# Patient Record
Sex: Female | Born: 1980 | Hispanic: Yes | Marital: Married | Smoking: Never smoker
Health system: Southern US, Community
[De-identification: ages and names within clinical notes are randomized; demographics above are authoritative.]

## PROBLEM LIST (undated history)

## (undated) HISTORY — PX: ABDOMINAL HYSTERECTOMY: SHX81

---

## 2015-02-04 ENCOUNTER — Emergency Department (INDEPENDENT_AMBULATORY_CARE_PROVIDER_SITE_OTHER)
Admission: EM | Admit: 2015-02-04 | Discharge: 2015-02-04 | Disposition: A | Discharge status: Home / Self Care | Payer: BLUE CROSS/BLUE SHIELD | Source: Home / Self Care | Attending: Emergency Medicine | Admitting: Emergency Medicine

## 2015-02-04 ENCOUNTER — Encounter: Payer: Self-pay | Admitting: *Deleted

## 2015-02-04 ENCOUNTER — Emergency Department (INDEPENDENT_AMBULATORY_CARE_PROVIDER_SITE_OTHER): Payer: BLUE CROSS/BLUE SHIELD

## 2015-02-04 DIAGNOSIS — M25512 Pain in left shoulder: Secondary | ICD-10-CM

## 2015-02-04 DIAGNOSIS — R52 Pain, unspecified: Secondary | ICD-10-CM

## 2015-02-04 DIAGNOSIS — R079 Chest pain, unspecified: Secondary | ICD-10-CM | POA: Diagnosis not present

## 2015-02-04 MED ORDER — IBUPROFEN 800 MG PO TABS: 800.0000 mg | ORAL_TABLET | Freq: Three times a day (TID) | ORAL | Status: DC

## 2015-02-04 MED ORDER — METHOCARBAMOL 500 MG PO TABS: 500.0000 mg | ORAL_TABLET | Freq: Two times a day (BID) | ORAL | Status: DC

## 2015-02-04 NOTE — Discharge Instructions (Signed)

## 2015-02-04 NOTE — ED Provider Notes (Signed)
CSN: [id number]     Arrival date & time 02/04/15  1846 History   First MD Initiated Contact with Patient 02/04/15 1908     Chief Complaint  Patient presents with  . Arm Pain   (Consider location/radiation/quality/duration/timing/severity/associated sxs/prior Treatment) Patient is a 34 y.o. female presenting with shoulder pain. The history is provided by the patient. No language interpreter was used.  Shoulder Pain Location:  Shoulder Time since incident:  2 days Injury: no   Shoulder location:  L shoulder Pain details:    Quality:  Aching   Radiates to:  Does not radiate   Severity:  Moderate   Onset quality:  Gradual   Timing:  Constant   Progression:  Worsening Dislocation: no   Foreign body present:  No foreign bodies Relieved by:  Nothing Worsened by:  Nothing tried Ineffective treatments:  None tried Associated symptoms: back pain     History reviewed. No pertinent past medical history. Past Surgical History  Procedure Laterality Date  . Abdominal hysterectomy     History reviewed. No pertinent family history. History  Substance Use Topics  . Smoking status: Never Smoker   . Smokeless tobacco: Not on file  . Alcohol Use: No   OB History    No data available     Review of Systems  Musculoskeletal: Positive for back pain.  All other systems reviewed and are negative. Pt works in a lab.   Pt complains of soreness in arm.  Pt reports pain in shoulder radiates down arm  Allergies  Review of patient's allergies indicates no known allergies.  Home Medications   Prior to Admission medications   Not on File   BP 122/80 mmHg  Pulse 73  Temp(Src) 98.7 F (37.1 C) (Oral)  Resp 16  Ht  (1.651 m)  Wt 175 lb (79.379 kg)  BMI 29.12 kg/m2  SpO2 99% Physical Exam  Constitutional: She is oriented to person, place, and time. She appears well-developed and well-nourished.  HENT:  Head: Normocephalic.  Eyes: EOM are normal.  Neck: Normal range of motion.   Pulmonary/Chest: Effort normal.  Abdominal: She exhibits no distension.  Musculoskeletal: Normal range of motion.  Pain with abduction and rotation of shoulder,  Ns and nv intact,   Tender at insertion ofchest. Pain with palpation,    Neurological: She is alert and oriented to person, place, and time.  Psychiatric: She has a normal mood and affect.  Nursing note and vitals reviewed.  EKG normal sinus, normal QRS, NOnspecific t changes,  No acute ED Course  Procedures (including critical care time) Labs Review Labs Reviewed - No data to display  Imaging Review Dg Chest 2 View  02/04/2015   CLINICAL DATA:  Left shoulder and left upper chest pain for 1 day  EXAM: CHEST  2 VIEW  COMPARISON:  None.  FINDINGS: The heart size and mediastinal contours are within normal limits. Both lungs are clear. The visualized skeletal structures are unremarkable.  IMPRESSION: No active cardiopulmonary disease.   Electronically Signed   By: Signa Kell M.D.   On: 02/04/2015 20:00   Dg Shoulder Left  02/04/2015   CLINICAL DATA:  34 year old female with left shoulder and left upper chest pain. Pain radiates down the left arm. No known injury.  EXAM: LEFT SHOULDER - 2+ VIEW  COMPARISON:  Concurrently obtained chest x-ray  FINDINGS: There is no evidence of fracture or dislocation. There is no evidence of arthropathy or other focal bone abnormality. Soft tissues  are unremarkable.  IMPRESSION: Negative.   Electronically Signed   By: Malachy MoanHeath  McCullough M.D.   On: 02/04/2015 20:03     MDM  I suspect pain is radicular  Shoulder or possibley neck.  Pain is not pulmonary or cardiac,  PERC is negative   1. Pain in joint, shoulder region, left   2. Pain    Ibuprofen Robaxin Follow up with Dr. Karie Schwalbe in one weel AVS    Lonia SkinnerLeslie K Arrow RockSofia, [place] JerseyPA-C 02/05/15 [phone]

## 2015-02-04 NOTE — ED Notes (Signed)
Pt c/o LT upper arm and shoulder pain x 2 days, with some nausea and SOB. She has taken ASA with no relief. BP at work was 141/80.

## 2015-10-06 ENCOUNTER — Emergency Department (INDEPENDENT_AMBULATORY_CARE_PROVIDER_SITE_OTHER): Payer: BLUE CROSS/BLUE SHIELD

## 2015-10-06 ENCOUNTER — Emergency Department (INDEPENDENT_AMBULATORY_CARE_PROVIDER_SITE_OTHER)
Admission: EM | Admit: 2015-10-06 | Discharge: 2015-10-06 | Disposition: A | Discharge status: Home / Self Care | Payer: BLUE CROSS/BLUE SHIELD | Source: Home / Self Care | Attending: Emergency Medicine | Admitting: Emergency Medicine

## 2015-10-06 ENCOUNTER — Encounter: Payer: Self-pay | Admitting: Emergency Medicine

## 2015-10-06 DIAGNOSIS — M7521 Bicipital tendinitis, right shoulder: Secondary | ICD-10-CM | POA: Diagnosis not present

## 2015-10-06 DIAGNOSIS — M25511 Pain in right shoulder: Secondary | ICD-10-CM | POA: Diagnosis not present

## 2015-10-06 MED ORDER — MELOXICAM 7.5 MG PO TABS: ORAL_TABLET | ORAL | Status: DC

## 2015-10-06 MED ORDER — HYDROCODONE-ACETAMINOPHEN 5-325 MG PO TABS: 1.0000 | ORAL_TABLET | ORAL | Status: DC | PRN

## 2015-10-06 NOTE — ED Notes (Signed)
Pt c/o right shoulder pain x 2 days.  No injury.

## 2015-10-06 NOTE — ED Provider Notes (Signed)
CSN: [id number]     Arrival date & time 10/06/15  [id number] History   First MD Initiated Contact with Patient 10/06/15 [phone]     Chief Complaint  Patient presents with  . Shoulder Pain   (Consider location/radiation/quality/duration/timing/severity/associated sxs/prior Treatment) Patient is a 34 y.o. female presenting with shoulder pain. The history is provided by the patient (And her sister). A language interpreter was used (Patient's sister. Patient declined obtaining any other interpreter. Patient speaks some [place], speaks Spanish well.).  Shoulder Pain Location:  Shoulder (Right) Time since incident:  2 days Upper extremity injury: Denies direct trauma, but she's done repetitive motions with right shoulder for the past week.   Pain details:    Quality:  Sharp   Radiates to:  Does not radiate   Severity:  Severe   Onset quality:  Unable to specify   Duration:  2 days   Timing:  Constant   Progression:  Worsening Prior injury to area:  No Relieved by:  Rest Worsened by:  Movement Ineffective treatments:  None tried Associated symptoms: decreased range of motion and stiffness   Associated symptoms: no back pain, no fever, no muscle weakness, no neck pain, no numbness, no swelling and no tingling   Risk factors: no known bone disorder, no frequent fractures and no recent illness     History reviewed. No pertinent past medical history. Past Surgical History  Procedure Laterality Date  . Abdominal hysterectomy     No family history on file. Social History  Substance Use Topics  . Smoking status: Never Smoker   . Smokeless tobacco: None  . Alcohol Use: No   OB History    No data available     Review of Systems  Constitutional: Negative for fever.  Respiratory: Negative for shortness of breath.   Cardiovascular: Negative for chest pain.  Gastrointestinal: Negative for abdominal pain.  Musculoskeletal: Positive for stiffness. Negative for back pain and neck pain.  All  other systems reviewed and are negative.   Allergies  Review of patient's allergies indicates no known allergies.  Home Medications   Prior to Admission medications   Medication Sig Start Date End Date Taking? Authorizing Provider  Ketorolac Tromethamine (TORADOL ORAL PO) Take by mouth.   Yes Historical Provider, MD  HYDROcodone-acetaminophen (NORCO/VICODIN) 5-325 MG tablet Take 1-2 tablets by mouth every 4 (four) hours as needed for severe pain. Take with food. 10/06/15   Lajean Manesavid Massey, MD  meloxicam (MOBIC) 7.5 MG tablet Take 1 twice a day as needed for pain. Take with food. (Do not take with any other NSAID.) 10/06/15   Lajean Manesavid Massey, MD   Meds Ordered and Administered this Visit  Medications - No data to display  BP 122/72 mmHg  Pulse 66  Temp(Src) 98.3 F (36.8 C) (Oral)  Ht 5\' 5"  (1.651 m)  Wt 182 lb 12 oz (82.895 kg)  BMI 30.41 kg/m2  SpO2 100% No data found.   Physical Exam  Constitutional: She is oriented to person, place, and time. She appears well-developed and well-nourished. No distress.  No cardiorespiratory distress, but she is very uncomfortable from right shoulder pain, splinting right shoulder to avoid movement.  HENT:  Head: Normocephalic and atraumatic.  Eyes: Conjunctivae and EOM are normal. Pupils are equal, round, and reactive to light. No scleral icterus.  Neck: Normal range of motion. No JVD present. No tracheal deviation present.  Cardiovascular: Normal rate and normal heart sounds.   Pulmonary/Chest: Effort normal and breath sounds normal.  Abdominal:  She exhibits no distension.  Musculoskeletal:  See below  Neurological: She is alert and oriented to person, place, and time.  Skin: Skin is warm. No rash noted.  Psychiatric: She has a normal mood and affect.  Nursing note and vitals reviewed.  Right shoulder:  Exquisitely tender right anterior shoulder, especially bicipital tendon. Decreased range of motion. Neurovascular intact. No  instability. Negative empty can sign. Remainder of exam of right upper extremity, including right elbow, forearm, hand, is all within normal limits. Nontender. No C-spine tenderness or deformity. ED Course  Procedures (including critical care time)  Labs Review Labs Reviewed - No data to display  Imaging Review X-ray right shoulder: FINDINGS: Three views of the right shoulder submitted. No acute fracture or subluxation. AC joint and glenohumeral joint are preserved. IMPRESSION: Negative.  Electronically Signed  By: Natasha Mead M.D.  On: 10/06/2015 10:06   MDM   1. Right shoulder pain   2. Bicipital tendinitis of right shoulder    I reviewed normal x-ray results with patient. Treatment options discussed, as well as risks, benefits, alternatives. Patient voiced understanding and agreement with the following plans: Procedure: After informed consent obtained, after risks, benefits, alternatives discussed, following procedure performed: Betadine prep. 1 mL of 2% Xylocaine and 10 mg of Kenalog are injected into the right bicipital tendon area.  Patient tolerated procedure well without complications.  Afterward, patient noted some improvement in the pain. Improved from 8 out of 10 down to a 4/10 intensity.  Sling applied. Discharge Medication List as of 10/06/2015 10:34 AM    START taking these medications   Details  HYDROcodone-acetaminophen (NORCO/VICODIN) 5-325 MG tablet Take 1-2 tablets by mouth every 4 (four) hours as needed for severe pain. Take with food., Starting 10/06/2015, Until Discontinued, Print    meloxicam (MOBIC) 7.5 MG tablet Take 1 twice a day as needed for pain. Take with food. (Do not take with any other NSAID.), Print       An After Visit Summary (in Spanish, per patient preference) was printed and given to the patient. Questions invited and answered. Follow-up with orthopedist within 7 days.--Emergency room if any red flags. Patient voiced  understanding and agreement with above.     Lajean Manes, MD 10/13/15 [phone]

## 2015-10-06 NOTE — Discharge Instructions (Signed)
Tendinitis del tendn del bceps (proximal) y tenosinovitis, con rehabilitacin (Biceps Tendon Tendinitis [Proximal] and Tenosynovitis With Rehab) La tendinitis y la tenosinovitis comprenden la inflamacin de la membrana que rodea el tendn (vaina del tendn). El tendn proximal del bceps es vulnerable a la tendinitis y a la tenosinovitis, lo que ocasiona dolor y Associate Professor en la parte anterior del hombro y Cabin crew. La vaina del tendn segrega un lquido que lo [place] y le permite funcionar adecuadamente sin Engineer, mining. Cuando el tendn y su [place] se inflaman, el tendn no puede deslizarse suavemente dentro de la vaina y Passenger transport manager. El tendn proximal del bceps une el msculo del bceps a los dos huesos del hombro. Es importante para una funcin [place] del codo y para Programme researcher, broadcasting/film/video la palma de la mano hacia arriba con la [place] (supinacin). La tendinitis de la porcin proximal del tendn del bceps puede ser un esguince de [place] 1  2 del tendn. El esguince de [place] 1 implica que el tendn se ha estirado suavemente sin seales de desgarro ni alargamiento. No hay prdida de fuerza. El esguince de [place] 2 implica pequeos desgarros en las fibras del tendn. El tendn o msculo se estira y la fuerza por lo general disminuye.  SNTOMAS  Puede sentir dolor, sensibilidad, hinchazn, calor y enrojecimiento en la zona del hombro.  Dolor que [employer]endant Corporation al usar el hombro y el codo, Haematologist cuando se le opone una resistencia.  Movimientos limitados del hombro o el codo.  Ruido de "crack" (crepitacin) al mover o tocar el tendn o el codo. CAUSAS Los sntomas se deben a la inflamacin del tendn. Las causas de la inflamacin pueden ser:  Esguince provocado por un brusco incremento en la cantidad o en la intensidad de alguna [place], o por el [place].  Golpe directo o lesin en el codo (poco frecuente).  Uso excesivo o repetitivo de la funcin de doblar el codo o doblar la [place], particularmente al  llevar la palma [place] arriba, o con la hiperextensin del codo. LOS RIESGOS AUMENTAN CON  Los deportes que implican un contacto o actividades en las que se deba levantar el brazo por arriba del nivel de la cabeza, as como deportes de lanzamiento, gimnasia, levantar pesas y Academic librarian.  Trabajos pesados.  Poca fuerza y flexibilidad.  No hacer un precalentamiento adecuado. PREVENCIN  Precalentamiento adecuado y elongacin antes de la [place].  Permtase un tiempo de [employer].  Mantener la forma fsica:  [place], flexibilidad y resistencia muscular.  Capacidad cardiovascular.  Aprenda y USAA. PRONSTICO Con el tratamiento adecuado, la tendinitis del tendn proximal del bceps se cura dentro de las 6 semanas. La curacin generalmente es ms rpida si la causa ha sido un golpe directo, a diferencia de si se debe al [employer].  POSIBLES COMPLICACIONES:  Tiempo de curacin prolongado, si no se trata adecuadamente o no se le da el tiempo suficiente como para curarse.  Tendn crnicamente inflamado, que causa un dolor persistente que puede avanzar hacia un dolor constante y potencialmente a la ruptura del tendn.  Recurrencia de los sntomas, especialmente si la actividad se reanuda [address], con el uso [place], un golpe directo o una tcnica deficiente. TRATAMIENTO El tratamiento inicial incluye el uso de medicamentos y la aplicacin de hielo para reducir Chief Technology Officer y la inflamacin. Modifique las [employer] causen Engineer, mining, para reducir las probabilidades de que la afeccin empeore. Los ejercicios de elongacin y fortalecimiento deben realizarse para Air traffic controller  el uso correcto de los msculos del hombro. Los ejercicios pueden Management consultant o con un terapeuta. Podrn indicarle otros tratamientos como el ultrasonido o la terapia con Airline pilot. En algunos casos se indica una inyeccin de corticoides para reducir la  inflamacin de la [place] del tendn. Generalmente la ciruga no es necesaria. A veces, si los sntomas duran ms de 6 meses, se aconsejar ciruga para desprender el tendn y Youth worker en el hueso del brazo. La ciruga para corregir otros problemas del hombro que pueden contribuir a la tendinits se recomiendan antes de la ciruga para la tendinitis misma.  MEDICAMENTOS   Si es necesaria la administracin de medicamentos para Chief Technology Officer, se recomiendan los antiinflamatorios no esteroides, como aspirina e ibuprofeno y otros calmantes menores, como acetaminofeno.  No tome medicamentos para el dolor dentro de los [address] previos a la [place].  Si su mdico lo considera necesario, Armed forces training and education officer. Utilcelos como se le indique y slo cuando lo necesite.  Se podrn recomendar inyecciones de corticoesteorides. Estas inyecciones deben reservarse para los casos ms graves, porque slo se pueden administrar una determinada cantidad de veces. CALOR Y FRO   El fro (con hielo) debe aplicarse durante 10 a 15 minutos cada 2  3 horas para reducir la inflamacin y Chief Technology Officer e inmediatamente despus de cualquier actividad que agrava los sntomas. Utilice bolsas o un masaje de hielo.  El calor puede usarse antes de Therapist, music y de las actividades de fortalecimiento indicadas por el profesional, le fisioterapeuta o Orthoptist. Utilice una bolsa trmica o un pao hmedo. SOLICITE ATENCIN MDICA SI:   Los sntomas empeoran o no mejoran en 2 semanas, a pesar de Medical illustrator.  Desarrolla nuevos e inexplicables sntomas. (Los medicamentos indicados en el tratamiento le ocasionan efectos secundarios). EJERCICIOS EJERCICIOS DE AMPLITUD DE MOVIMIENTOS Y ELONGACIN Tendn del bceps (proximal) Estos ejercicios le ayudarn en la recuperacin de la lesin. Los sntomas podrn aliviarse con o sin una asistencia adicional de su mdico, fisioterapeuta o Herbalist. Al completar estos ejercicios,  recuerde:   Restaurar la flexibilidad del tejido ayuda a que las articulaciones recuperen el movimiento normal. Esto permite que el movimiento y la actividad sea ms saludables y menos dolorosos.  Para que sea efectiva, cada elongacin debe realizarse durante al menos 30 segundos.  La elongacin nunca debe ser dolorosa. Deber sentir slo un alargamiento o distensin suave del tejido que estira. ELONGACIN Flexin [employer] de pie con una buena postura. Tome un palo de escoba o caa con una palma derecha / izquierdo hacia abajo y la otra palma hacia arriba de modo que sus manos tengan una separacin de un poco ms que el ancho de sus hombros.  Mantenga su codo derecha / izquierdo recto y los msculos de los hombros [place], y [place] palo con su manos opuesta para elevar su brazo derecha / izquierdo frente a su cuerpo y [employer]ack cabeza. Levante el brazo hasta que sientas un estiramiento en el hombro derecha / izquierdo, pero sin sentir un aumento del dolor.  Evite encoger su hombro derecha / izquierdo cuando eleve el brazo manteniendo el omplato hacia abajo y hacia la columna vertebral en la zona media de la espalda. Mantenga esta posicin durante __________ segundos.  Vuelva lentamente a la posicin inicial.  Reptalo __________ veces. Realice este ejercicio __________ veces por da. FUERZA [place], supina  Acustese sobre la espada. Tome un palo de escoba o caa con una palma derecha /  izquierdo [place] abajo y la otra palma hacia arriba de modo que sus manos tengan una separacin de un poco ms que el ancho de sus hombros.  Mantenga su codo derecha / izquierdo recto y los msculos de los hombros Benjamin Perez, y [place] con su mano opuesta para elevar su brazo derecha / izquierdo hacia un lado de su cuerpo y [employer] la cabeza. Levante el brazo hasta que sientas un estiramiento en el hombro derecha / izquierdo, pero sin sentir un aumento del dolor.  Evite encoger su  hombro derecha / izquierdo cuando eleve el brazo manteniendo el omplato hacia abajo y hacia la columna vertebral en la zona media de la espalda. Mantenga esta posicin durante __________ segundos.  Vuelva lentamente a la posicin inicial.  Reptalo __________ veces. Realice este ejercicio __________ veces por da. AMPLITUD DE MOVIMIENTOS - Flexin, asistida activa  Acustese sobre la espada. Podr doblar las rodillas para estar ms cmodo  Tome un palo de escoba o un bastn de modo que sus manos queden a la misma distancia que sus hombros. Su mano derecha / izquierdo debe asir un extremo del palo de modo que quede colocada con el pulgar hacia arriba, como si fuera a Optometrist.  Usando su brazo sano como gua, eleve su brazo Sales executive / izquierdo sobre la cabeza Teacher, adult education sentir un suave estiramiento en el hombro. Mantenga esta posicin durante __________ segundos.  Use el palo para ayudar a su brazo derecha / izquierdo a volver a Architect.  Reptalo __________ veces. Realice este ejercicio __________ veces por da. ELONGACIN Flexin De pie  Colquese frente a una pared. [place] con sus dedos derecha / izquierdo hacia arriba por la pared hasta sentir un estiramiento moderado en el hombro. A medida que su mano asciende, podr sentir la necesidad de dar un paso hacia la pared o use el marco de una puerta para caminar a travs de la misma.  Evite encoger su hombro derecha / izquierdo cuando eleve el brazo manteniendo el omplato hacia abajo y hacia la columna vertebral en la zona media de la espalda.  Mantenga esta posicin durante __________ segundos. Si es necesario, utilice su otra mano para facilitar el estriramiento y Programme researcher, broadcasting/film/video a la posicin inicial.  Reptalo __________ D[employer] Restaurants. Realice este estiramiento __________ [place] por da. AMPLITUD DE MOVIMIENTOS Rotacin interna  Tome un palo con ambas manos por detrs de la espalda, con las palmas [place].  De pie y erguido en  buena postura, deslice el palo hacia arriba por la espalda hasta sentir un suave estiramiento en la zona anterior de los hombros.  Mantenga esta posicin durante __________ segundos. Vuelva lentamente a la posicin inicial. Reptalo __________ veces. Realice este ejercicio __________ veces por da.  ELONGACIN Rotacin interna  Coloque la mano derecha / izquierdo detrs de la espalda con las palmas [place] arriba.  Coloque una toalla o cinturn sobre el hombro opuesto. Tome la toalla con su mano derecha / izquierdo  Manteniendo una postura erguida, tire suavemente de la toalla hasta sentir un estiramiento en la parte frontal del hombroderecha / izquierdo  Garment/textile technologist su hombro derecha / izquierdo cuando eleve el brazo manteniendo el omplato hacia abajo y hacia la columna vertebral en la zona media de la espalda.  Mantenga esta posicin durante __________ segundos. Afloje la tensin bajando la mano opuesta. Reptalo __________ veces. Realice este ejercicio __________ veces por da.  EJERCICIOS DE FORTALECIMIENTO - Tendinitis del tendn del bceps (proximal) Estos ejercicios lo ayudarn  a recobrar la fuerza, luego de que el [employer] retire el yeso o soporte. Los sntomas podrn desaparecer con o sin mayor intervencin del profesional, el fisioterapeuta o Orthoptist. Al completar estos ejercicios, recuerde:   Los msculos pueden ganar la resistencia y la fuerza necesarias para las actividades diarias a travs de ejercicios controlados.  Realice los ejercicios como se lo indic el mdico, el fisioterapeuta o Orthoptist. Aumente la resistencia y las repeticiones segn se le haya indicado.  Podr experimentar dolor o cansancio muscular, pero el dolor o molestia que trata de eliminar a travs de los ejercicios nunca debe empeorar. Si el dolor empeora, detngase y asegrese de que est siguiendo las directivas correctamente. Si an siente dolor luego de Education officer, environmental lo ajustes necesarios, deber  discontinuar el ejercicio hasta que pueda conversar con el profesional sobre el problema. FUERZA Flexin del codo, isomtricos  Prese o sintese erguido [employer]. Coloque su brazo Sales executive / izquierdo para que la palma de su mano quede hacia arriba a la altura de su cintura.  Coloque la mano opuesta sobre el Product manager. Empuje suavemente hacia abajo mientras su brazo derecha / izquierdo opone resistencia. Empuje tan intensamente como pueda con ambos brazos sin causar Scientist, research (medical) ni realizar movimientos con su codo derecha / izquierdo. Mantenga esta posicin durante __________ segundos.  Libere la tensin de ambos brazos gradualmente. Permita que sus msculos se relajen completamente antes de repetir. Reptalo __________ veces. Realice este ejercicio __________ veces por da. FUERZA Flexin del hombro, isomtricos  Manteniendo una buena postura y enfrentando una pared, prese o sintese a 4 a 6 pulgadas de la misma.  Manteniendo su codo derecha / izquierdo derecho, presione suavemente la parte superior del puo contra la pared. Aumente gradualmente la presin [employer] pueda sin llegar a encojer el hombro ni Tax inspector.  Mantenga esta posicin durante __________ segundos.  Libere la tensin lentamente. Relaje los msculos de los hombros completamente antes de repetir. Reptalo __________ veces. Realice este ejercicio __________ veces por da.  FUERZA Flexores del codo, supinacin  Con una buena [place], pngase de pie o sintese en una silla firme sin apoyabrazos. Permita que su brazo derecha / izquierdo descanse a su lado con lapalmamirando hacia adelante.  Sosteniendo un peso de __________ o una banda o tubo de goma para ejercicios,  Lleve la mano hacia el hombro.  Deje que sus msculos controlen la resistencia mientras la mano vuelve a su lado. Reptalo __________ veces. Realice este ejercicio __________ veces por da.  FUERZA Flexin del hombro  Prese  o sintese en una postura correcta. Sostenga un peso de __________ Cheron Schaumann banda de goma para ejercicios, de modo que su mano quede con el pulgar hacia arriba, como cuando [employer].  Levante lentamente el brazo derecha / izquierdo lo ms lejos que pueda, sin Chemical engineer. Inicialmente, muchas personas slo pueden levantar la mano hasta la altura del hombro.  Evite encoger su hombro derecha / izquierdo cuando eleve el brazo manteniendo el omplato hacia abajo y hacia la columna vertebral en la zona media de la espalda.  Mantenga esta posicin durante __________ segundos. Controle el descenso de la mano de modo que vuelva a la posicin inicial lo ms lentamente posible. Reptalo __________ veces. Realice este ejercicio __________ veces por da.    Esta informacin no tiene Theme park manager el consejo del mdico. Asegrese de hacerle al mdico cualquier pregunta que tenga.   Document Released: 07/14/2006  Document Revised: 10/18/2014 Elsevier Interactive Patient Education [employer]2016 Elsevier Inc.

## 2017-04-10 IMAGING — CR DG SHOULDER 2+V*R*
3 series · 3 of 3 positions shown · non-contrast
Comparison: None.

CLINICAL DATA: Right shoulder pain for 3 days, no known injury

EXAM:
RIGHT SHOULDER - 2+ VIEW

[shoulder grashey]
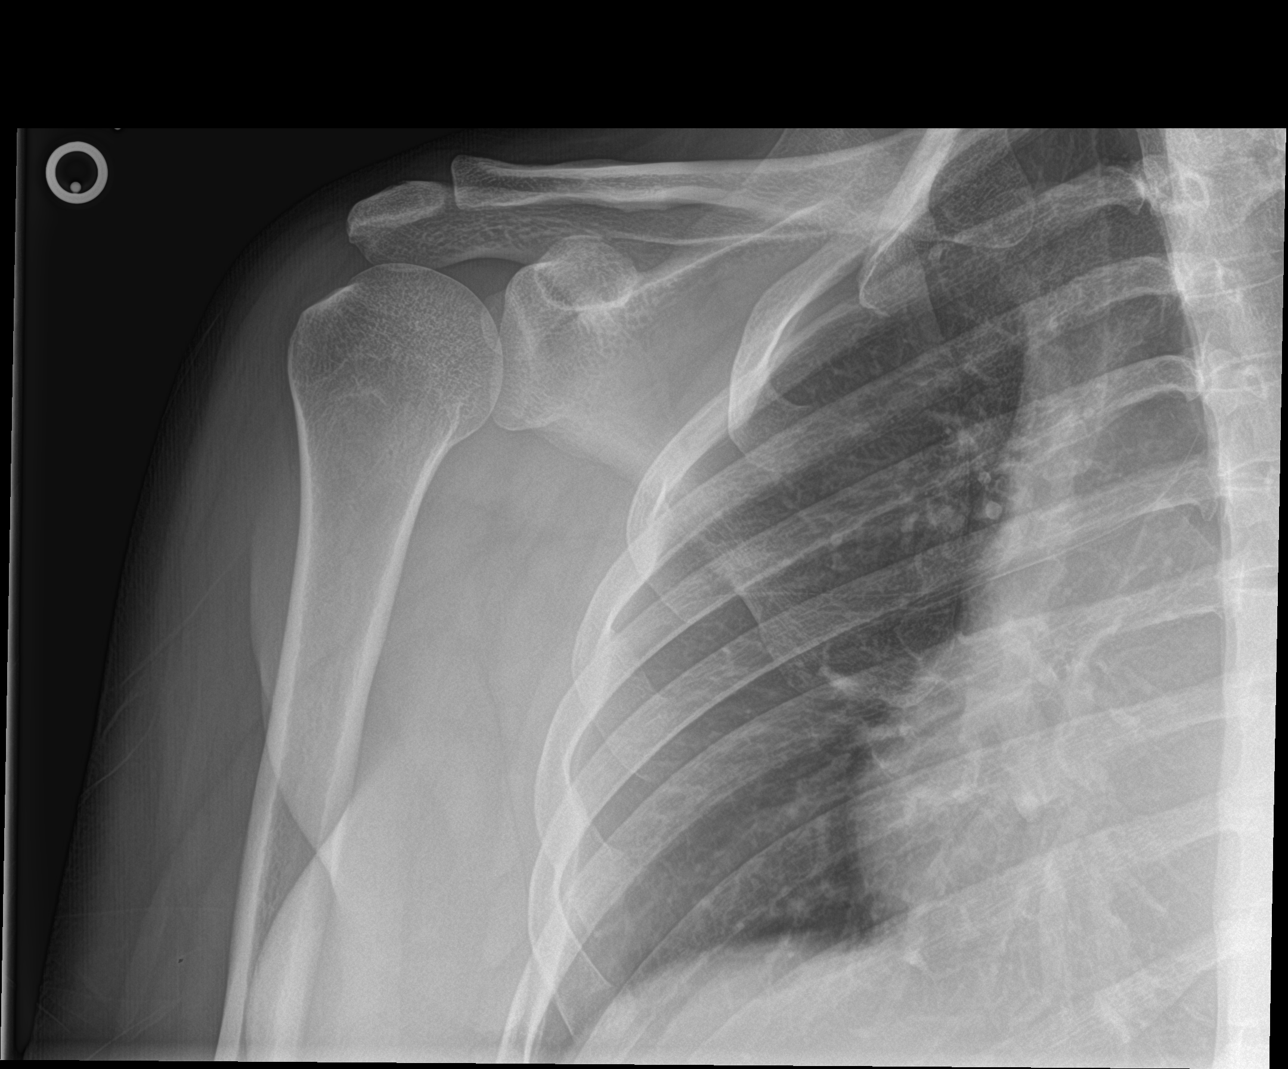

[shoulder y view]
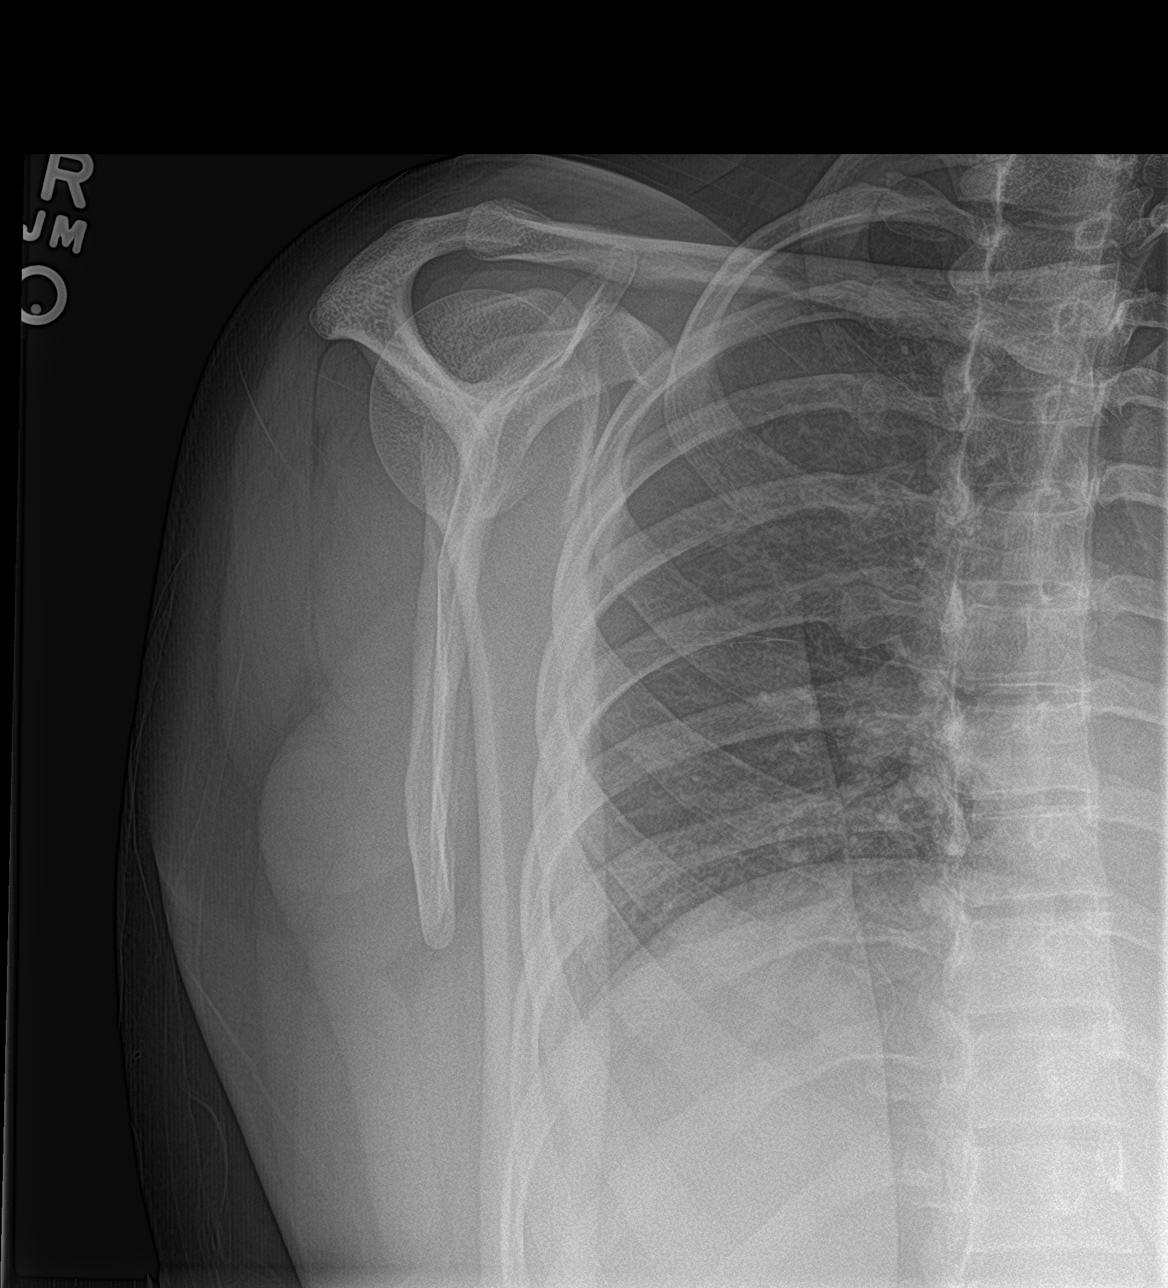

[shoulder axillary]
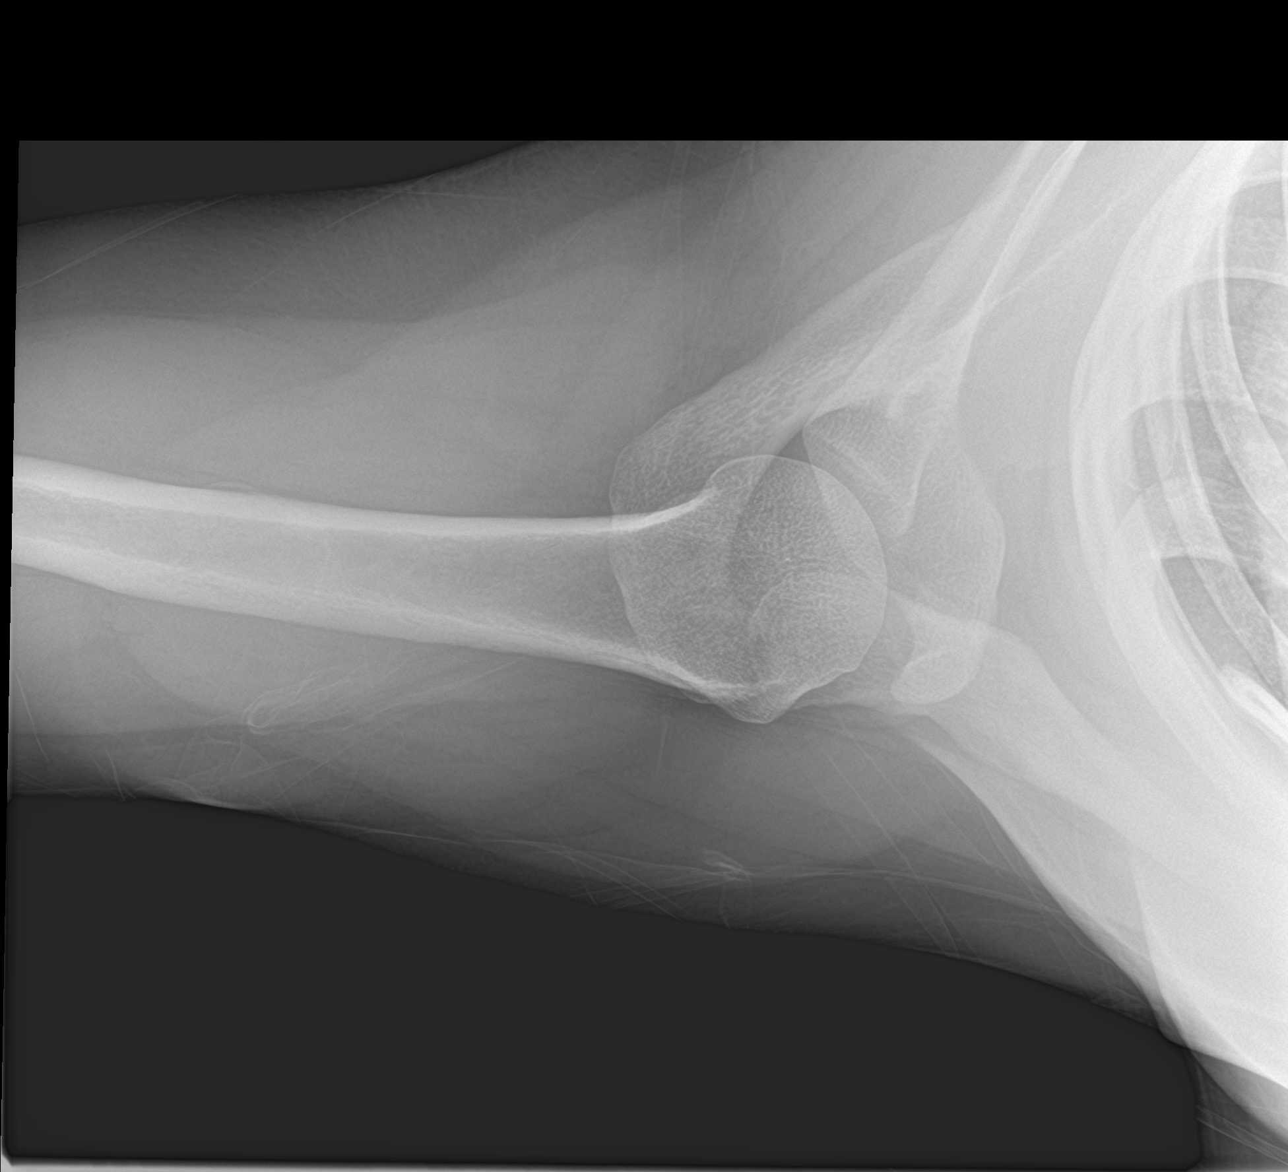

[3 of 3 positions shown; findings below may reference images not displayed]

FINDINGS: Three views of the right shoulder submitted. No acute fracture or
subluxation. AC joint and glenohumeral joint are preserved.
IMPRESSION: Negative.

## 2020-10-29 DIAGNOSIS — U099 Post covid-19 condition, unspecified: Secondary | ICD-10-CM | POA: Diagnosis not present

## 2020-12-28 DIAGNOSIS — R110 Nausea: Secondary | ICD-10-CM | POA: Diagnosis not present

## 2020-12-28 DIAGNOSIS — Z87891 Personal history of nicotine dependence: Secondary | ICD-10-CM | POA: Diagnosis not present

## 2020-12-28 DIAGNOSIS — R1013 Epigastric pain: Secondary | ICD-10-CM | POA: Diagnosis not present

## 2020-12-28 DIAGNOSIS — K810 Acute cholecystitis: Secondary | ICD-10-CM | POA: Diagnosis not present

## 2020-12-28 DIAGNOSIS — R1011 Right upper quadrant pain: Secondary | ICD-10-CM | POA: Diagnosis not present

## 2020-12-28 DIAGNOSIS — R1110 Vomiting, unspecified: Secondary | ICD-10-CM | POA: Diagnosis not present

## 2020-12-28 DIAGNOSIS — K828 Other specified diseases of gallbladder: Secondary | ICD-10-CM | POA: Diagnosis not present

## 2020-12-30 DIAGNOSIS — K8050 Calculus of bile duct without cholangitis or cholecystitis without obstruction: Secondary | ICD-10-CM | POA: Diagnosis not present

## 2021-01-02 DIAGNOSIS — K760 Fatty (change of) liver, not elsewhere classified: Secondary | ICD-10-CM | POA: Diagnosis not present

## 2021-01-02 DIAGNOSIS — K8050 Calculus of bile duct without cholangitis or cholecystitis without obstruction: Secondary | ICD-10-CM | POA: Diagnosis not present

## 2021-01-02 DIAGNOSIS — K8020 Calculus of gallbladder without cholecystitis without obstruction: Secondary | ICD-10-CM | POA: Diagnosis not present

## 2021-01-30 DIAGNOSIS — K8050 Calculus of bile duct without cholangitis or cholecystitis without obstruction: Secondary | ICD-10-CM | POA: Diagnosis not present

## 2021-01-30 DIAGNOSIS — K8020 Calculus of gallbladder without cholecystitis without obstruction: Secondary | ICD-10-CM | POA: Diagnosis not present

## 2021-01-30 DIAGNOSIS — K8010 Calculus of gallbladder with chronic cholecystitis without obstruction: Secondary | ICD-10-CM | POA: Diagnosis not present

## 2021-01-30 DIAGNOSIS — Z87891 Personal history of nicotine dependence: Secondary | ICD-10-CM | POA: Diagnosis not present

## 2021-08-19 DIAGNOSIS — R55 Syncope and collapse: Secondary | ICD-10-CM | POA: Diagnosis not present

## 2021-10-25 DIAGNOSIS — F419 Anxiety disorder, unspecified: Secondary | ICD-10-CM | POA: Diagnosis not present

## 2022-04-13 DIAGNOSIS — G8929 Other chronic pain: Secondary | ICD-10-CM | POA: Diagnosis not present

## 2022-04-13 DIAGNOSIS — M25511 Pain in right shoulder: Secondary | ICD-10-CM | POA: Diagnosis not present

## 2022-04-13 DIAGNOSIS — Z87891 Personal history of nicotine dependence: Secondary | ICD-10-CM | POA: Diagnosis not present

## 2022-04-27 ENCOUNTER — Ambulatory Visit (INDEPENDENT_AMBULATORY_CARE_PROVIDER_SITE_OTHER): Payer: BC Managed Care – PPO

## 2022-04-27 ENCOUNTER — Ambulatory Visit (INDEPENDENT_AMBULATORY_CARE_PROVIDER_SITE_OTHER): Payer: BC Managed Care – PPO | Admitting: Sports Medicine

## 2022-04-27 DIAGNOSIS — M7541 Impingement syndrome of right shoulder: Secondary | ICD-10-CM | POA: Insufficient documentation

## 2022-04-27 NOTE — Progress Notes (Signed)
    Procedures performed today:    Procedure: Real-time Ultrasound Guided injection of the right subacromial bursa Device: Samsung HS60  Verbal informed consent obtained.  Time-out conducted.  Noted no overlying erythema, induration, or other signs of local infection.  Skin prepped in a sterile fashion.  Local anesthesia: Topical Ethyl chloride.  With sterile technique and under real time ultrasound guidance: Noted intact rotator cuff and moderate bursitis, 1 cc Kenalog 40, 1 cc lidocaine, 1 cc bupivacaine injected easily Completed without difficulty  Advised to call if fevers/chills, erythema, induration, drainage, or persistent bleeding.  Images permanently stored and available for review in PACS.  Impression: Technically successful ultrasound guided injection.  Independent interpretation of notes and tests performed by another provider:   None.  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 Good strength but all positive impingement signs. We discussed the anatomy and pathophysiology, pain is severe enough for we will go ahead and do a subacromial injection, x-rays at the ED were unrevealing. Adding home rotator cuff conditioning and a Thera-Band. Return to see me in 6 weeks.  Chronic process with exacerbation and pharmacologic intervention.   ____________________________________________ Ihor Austin. Benjamin Stain, M.D., ABFM., CAQSM., AME. Primary Care and Sports Medicine [facility]  Adjunct Professor of Family Medicine  [place] [employer] [facility] [employer]  Restaurant manager, fast food

## 2022-04-27 NOTE — Assessment & Plan Note (Signed)
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 Good strength but all positive impingement signs. We discussed the anatomy and pathophysiology, pain is severe enough for we will go ahead and do a subacromial injection, x-rays at the ED were unrevealing. Adding home rotator cuff conditioning and a Thera-Band. Return to see me in 6 weeks.

## 2022-05-17 ENCOUNTER — Telehealth: Payer: BC Managed Care – PPO | Admitting: Sports Medicine

## 2022-05-17 DIAGNOSIS — M7541 Impingement syndrome of right shoulder: Secondary | ICD-10-CM | POA: Diagnosis not present

## 2022-05-17 NOTE — Assessment & Plan Note (Signed)
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 She has improved to some degree but still has occasional discomfort. She needed FMLA paperwork filled out for her time out of work, but she is back at work for now. We will go ahead and proceed to MRI that she has continued to have pain but we will hold off on additional intervention for now, we could certainly try a second subacromial injection considering severity of the bursitis, she does have an appointment follow-up scheduled with me on the 28th. I will see her then.

## 2022-05-17 NOTE — Progress Notes (Addendum)
   Virtual Visit via WebEx/MyChart   I connected with  Hannah Myers  on 05/18/22 via WebEx/MyChart/Doximity Video and verified that I am speaking with the correct person using two identifiers.   I discussed the limitations, risks, security and privacy concerns of performing an evaluation and management service by WebEx/MyChart/Doximity Video, including the higher likelihood of inaccurate diagnosis and treatment, and the availability of in person appointments.  We also discussed the likely need of an additional face to face encounter for complete and high quality delivery of care.  I also discussed with the patient that there may be a patient responsible charge related to this service. The patient expressed understanding and wishes to proceed.  Provider location is in medical facility. Patient location is at their home, different from provider location. People involved in care of the patient during this telehealth encounter were myself, my nurse/medical assistant, and my front office/scheduling team member.  Review of Systems: No fevers, chills, night sweats, weight loss, chest pain, or shortness of breath.   Objective Findings:    General: Speaking full sentences, no audible heavy breathing.  Sounds alert and appropriately interactive.  Appears well.  Face symmetric.  Extraocular movements intact.  Pupils equal and round.  No nasal flaring or accessory muscle use visualized.  Independent interpretation of tests performed by another provider:   None.  Brief History, Exam, Impression, and Recommendations:    Impingement syndrome, shoulder, right This is a very pleasant 41 year old female, she has an office job, she is having increasing pain right shoulder, she had severe pain so we did a subacromial injection at the last visit, I did see moderate bursitis. She has improved to some degree but still has occasional discomfort. She needed FMLA paperwork filled out for her time out of work, but  she is back at work for now. We will go ahead and proceed to MRI that she has continued to have pain but we will hold off on additional intervention for now, we could certainly try a second subacromial injection considering severity of the bursitis, she does have an appointment follow-up scheduled with me on the 28th. I will see her then.  I discussed the above assessment and treatment plan with the patient. The patient was provided an opportunity to ask questions and all were answered. The patient agreed with the plan and demonstrated an understanding of the instructions.   The patient was advised to call back or seek an in-person evaluation if the symptoms worsen or if the condition fails to improve as anticipated.   I provided 30 minutes of face to face and non-face-to-face time during this encounter date, time was needed to gather information, review chart, records, communicate/coordinate with staff remotely, as well as complete documentation.   ____________________________________________ Ihor Austin. Benjamin Stain, M.D., ABFM., CAQSM., AME. Primary Care and Sports Medicine [facility]  Adjunct Professor of Family Medicine  [place] [employer] [facility] [employer]  Restaurant manager, fast food

## 2022-05-22 ENCOUNTER — Ambulatory Visit (INDEPENDENT_AMBULATORY_CARE_PROVIDER_SITE_OTHER): Payer: BC Managed Care – PPO

## 2022-05-22 DIAGNOSIS — M7541 Impingement syndrome of right shoulder: Secondary | ICD-10-CM | POA: Diagnosis not present

## 2022-05-22 DIAGNOSIS — M25511 Pain in right shoulder: Secondary | ICD-10-CM | POA: Diagnosis not present

## 2022-06-08 ENCOUNTER — Ambulatory Visit: Payer: BC Managed Care – PPO | Admitting: Sports Medicine

## 2022-06-08 DIAGNOSIS — M7541 Impingement syndrome of right shoulder: Secondary | ICD-10-CM

## 2022-06-08 NOTE — Assessment & Plan Note (Signed)
This pleasant 41 year old female returns, she is doing really well after subacromial injection, she has been doing the home conditioning, she can return to work and come back to see me on an as-needed basis.

## 2022-06-08 NOTE — Progress Notes (Signed)
    Procedures performed today:    None.  Independent interpretation of notes and tests performed by another provider:   None.  Brief History, Exam, Impression, and Recommendations:    Impingement syndrome, shoulder, right This pleasant 41 year old female returns, she is doing really well after subacromial injection, she has been doing the home conditioning, she can return to work and come back to see me on an as-needed basis.    ____________________________________________ Ihor Austin. Benjamin Stain, M.D., ABFM., CAQSM., AME. Primary Care and Sports Medicine [facility]  Adjunct Professor of Family Medicine  [place] [employer] [facility] [employer]  Restaurant manager, fast food

## 2023-10-25 ENCOUNTER — Ambulatory Visit: Payer: BC Managed Care – PPO

## 2023-10-25 ENCOUNTER — Ambulatory Visit: Payer: BC Managed Care – PPO | Admitting: Sports Medicine

## 2023-10-25 DIAGNOSIS — M069 Rheumatoid arthritis, unspecified: Secondary | ICD-10-CM | POA: Diagnosis not present

## 2023-10-25 DIAGNOSIS — M70812 Other soft tissue disorders related to use, overuse and pressure, left shoulder: Secondary | ICD-10-CM | POA: Diagnosis not present

## 2023-10-25 DIAGNOSIS — M25512 Pain in left shoulder: Secondary | ICD-10-CM | POA: Diagnosis not present

## 2023-10-25 DIAGNOSIS — M7541 Impingement syndrome of right shoulder: Secondary | ICD-10-CM | POA: Diagnosis not present

## 2023-10-25 DIAGNOSIS — M2550 Pain in unspecified joint: Secondary | ICD-10-CM | POA: Diagnosis not present

## 2023-10-25 DIAGNOSIS — M0579 Rheumatoid arthritis with rheumatoid factor of multiple sites without organ or systems involvement: Secondary | ICD-10-CM

## 2023-10-25 DIAGNOSIS — M25542 Pain in joints of left hand: Secondary | ICD-10-CM | POA: Diagnosis not present

## 2023-10-25 DIAGNOSIS — Z0389 Encounter for observation for other suspected diseases and conditions ruled out: Secondary | ICD-10-CM | POA: Diagnosis not present

## 2023-10-25 MED ORDER — MELOXICAM 15 MG PO TABS: ORAL_TABLET | ORAL | 3 refills | Status: AC

## 2023-10-25 NOTE — Assessment & Plan Note (Signed)
 Bilateral shoulder impingement syndrome, we did an injection right sided back in 2023 and she did really well, she has been since diagnosed with rheumatoid arthritis. She is having recurrence of bilateral shoulder pain, impingement signs on exam. Adding x-rays, formal PT, meloxicam , if insufficient improvement we will proceed with subacromial injections with ultrasound guidance.

## 2023-10-25 NOTE — Assessment & Plan Note (Signed)
 This is a very pleasant 43 year old female, she recently went a trip to [place], she had a flare of pain both hands MCPs with significant swelling, erythema, both shoulders. She saw a rheumatologist in [place], some blood work was obtained that did show a positive rheumatoid factor, positive CRP. I do not see that a CCP was done. We have scanned these into the chart. She was started on methotrexate , she is on an up taper, and will eventually be on 15 mg weekly, folic acid twice a week, she is on a prednisone taper currently. When she finishes the prednisone taper we will go ahead and get her started on meloxicam , I would also like x-rays of both hands, both shoulders, she will start formal physical therapy for her shoulders and return to see me in about 6 weeks. Ultimately we also need to get her plugged in with a rheumatologist here.

## 2023-10-25 NOTE — Progress Notes (Signed)
    Procedures performed today:    None.  Independent interpretation of notes and tests performed by another provider:   None.  Brief History, Exam, Impression, and Recommendations:    Rheumatoid arthritis, seropositive, multiple sites [facility]) This is a very pleasant 43 year old female, she recently went a trip to [place], she had a flare of pain both hands MCPs with significant swelling, erythema, both shoulders. She saw a rheumatologist in [place], some blood work was obtained that did show a positive rheumatoid factor, positive CRP. I do not see that a CCP was done. We have scanned these into the chart. She was started on methotrexate , she is on an up taper, and will eventually be on 15 mg weekly, folic acid twice a week, she is on a prednisone taper currently. When she finishes the prednisone taper we will go ahead and get her started on meloxicam , I would also like x-rays of both hands, both shoulders, she will start formal physical therapy for her shoulders and return to see me in about 6 weeks. Ultimately we also need to get her plugged in with a rheumatologist here.  Impingement syndrome, shoulder, right Bilateral shoulder impingement syndrome, we did an injection right sided back in 2023 and she did really well, she has been since diagnosed with rheumatoid arthritis. She is having recurrence of bilateral shoulder pain, impingement signs on exam. Adding x-rays, formal PT, meloxicam , if insufficient improvement we will proceed with subacromial injections with ultrasound guidance.    ____________________________________________ Debby PARAS. Curtis, M.D., ABFM., CAQSM., AME. Primary Care and Sports Medicine [facility]  Adjunct Professor of [facility] Medicine  [employer]  Restaurant Manager, [employer]

## 2023-11-08 NOTE — Therapy (Signed)
OUTPATIENT PHYSICAL THERAPY SHOULDER EVALUATION   Patient Name: Hannah Myers MRN: [id number] DOB:May 13, 1981, 43 y.o., female Today's Date: 11/09/2023  END OF SESSION:  PT End of Session - 11/09/23 0711     Visit Number 1    Number of Visits 13    Date for PT Re-Evaluation 12/21/23    Authorization Type BCBS    PT Start Time 0712    PT Stop Time 0755    PT Time Calculation (min) 43 min    Activity Tolerance Patient tolerated treatment well             History reviewed. No pertinent past medical history. Past Surgical History:  Procedure Laterality Date   ABDOMINAL HYSTERECTOMY     Patient Active Problem List   Diagnosis Date Noted   Rheumatoid arthritis, seropositive, multiple sites (HCC) 10/25/2023   Impingement syndrome, shoulder, right 04/27/2022    PCP: no PCP in chart  REFERRING PROVIDER: Monica Becton, MD  REFERRING DIAG: M05.79 (ICD-10-CM) - Rheumatoid arthritis, seropositive, multiple sites (HCC)  THERAPY DIAG:  Chronic pain of both shoulders  Abnormal posture  Rationale for Evaluation and Treatment: Rehabilitation  ONSET DATE: ~ 8 years ago, increasing over past year  SUBJECTIVE:                                                                                                                                                                                      SUBJECTIVE STATEMENT: Pt arrives w/ in person interpreter who is present throughout, although pt communicates proficiently in English throughout session without issue. Pt endorses shoulder pain over past 8 years or so, has fluctuated historically but increased more steadily over past year. States she used to only have flares 1-2x/year, but progressed to the point she would have 1-2 flares a week, typically last a couple days. She states she has had to modify activities to avoid flares. Enjoys participating in sports with her children and has had to limit this - states she had a  significant flare after trying to play pickleball a couple months ago. She does note that with a recent change in her medication symptoms have improved notably, but still persist and limit activities. She states pain is typically anterior in both shoulders, R>L. On the R arm she will get pain down to elbow at times, and will have tingling in index/middle finger (usually just before flares occur). Denies any LUE referred pain/tingling. She denies any recent changes in regards to headaches, diplopia, speech/swallowing.  Hand dominance: Right  PERTINENT HISTORY: RA  PAIN:  Are you having pain: 1/10 Location/description: BIL shoulders R>L, anterior shoulder; sometimes on R shoulder will refer to  elbow  Best-worst over past week: 0-3/10 (since medication change) - aggravating factors: bathing, upper body dressing, doing hair, making bed, reaching overhead, lying supine - Easing factors: medication,  standing  PRECAUTIONS: None  WEIGHT BEARING RESTRICTIONS: No  FALLS:  Has patient fallen in last 6 months? No  LIVING ENVIRONMENT: 1 level home, 4-5 STE Lives w/ husband and 2 kids (15 and 86) Housework split  OCCUPATION: Works in office  PLOF: Independent  PATIENT GOALS: learn how to manage symptoms independently   NEXT MD VISIT: 11/10/23  OBJECTIVE:  Note: Objective measures were completed at Evaluation unless otherwise noted.  DIAGNOSTIC FINDINGS:  BIL shoulder XR 10/25/23 - reassuring, refer to [facility] for details BIL hand XR 10/25/23 - reassuring, refer to [facility] for details  PATIENT SURVEYS:  QuickDASH: 38.6%   COGNITION: Overall cognitive status: Within functional limits for tasks assessed     SENSATION: Light touch intact BIL UE   POSTURE: Forward head, rounded shoulders BIL, increased thoracic kyphosis  UPPER EXTREMITY ROM:  A/PROM Right eval Left eval  Shoulder flexion 151 deg 160 deg  Shoulder abduction 105 deg * 130 deg *  Shoulder internal rotation    Shoulder  external rotation (functional combo) Thumb at scap spine * Thumb at scap spine *  Elbow flexion    Elbow extension    Wrist flexion    Wrist extension     (Blank rows = not tested) (Key: WFL = within functional limits not formally assessed, * = concordant pain, s = stiffness/stretching sensation, NT = not tested)  Comments: cervical ROM WFL and painless all directions  UPPER EXTREMITY MMT:  MMT Right eval Left eval  Shoulder flexion 5 5  Shoulder extension    Shoulder abduction 4+ 4 *  Shoulder extension    Shoulder internal rotation 4 * 4+  Shoulder external rotation 4+ 4+  Elbow flexion    Elbow extension    Grip strength    (Blank rows = not tested)  (Key: WFL = within functional limits not formally assessed, * = concordant pain, s = stiffness/stretching sensation, NT = not tested)  Comments:   SHOULDER SPECIAL TESTS: Positive neer's BIL, R more so than L   PALPATION:  Concordant tenderness L infraspinatus/deltoid, tightness R side but no overt pain                                                                                                                             TREATMENT DATE:  [facility] Adult PT Treatment:                                                DATE: 11/09/23 Therapeutic Exercise: Double ER + scap retraction unresisted x8 cues for posture/form Thoracic extension, seated x8 cues for comfortable ROM HEP handout + education, relevant anatomy/physiology, rationale  for interventions    PATIENT EDUCATION: Education details: Pt education on PT impairments, prognosis, and POC. Informed consent. Rationale for interventions, safe/appropriate HEP performance Person educated: Patient Education method: Explanation, Demonstration, Tactile cues, Verbal cues Education comprehension: verbalized understanding, returned demonstration, verbal cues required, tactile cues required, and needs further education    HOME EXERCISE PROGRAM: Access Code: [email or web address] URL:  [email or web address] Date: 11/09/2023 Prepared by: Fransisco Hertz  Exercises - Shoulder External Rotation and Scapular Retraction  - 2-3 x daily - 1 sets - 8-10 reps - Seated Thoracic Lumbar Extension  - 2-3 x daily - 1 sets - 8-10 reps  ASSESSMENT:  CLINICAL IMPRESSION: Patient is a pleasant 43 y.o. woman who was seen today for physical therapy evaluation and treatment for RA with R>L shoulder pain. She endorses symptoms for ~8 years, increased over past year and limiting her ability to perform housework and self care tasks, as well as limiting participation in recreational activities with her children. She does report noted improvement since recent medication change. On exam today she demonstrates reduced GH mobility/strength, postural deficits as above, and concordant tenderness L RC musculature; significantly positive Neer's sign BIL (R>L). 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 No adverse events. Recommend trial of skilled PT to address aforementioned deficits with aim of improving functional tolerance and reducing pain with typical activities. Pt departs today's session in no acute distress, all voiced concerns/questions addressed appropriately from PT perspective.    OBJECTIVE IMPAIRMENTS: decreased activity tolerance, decreased endurance, decreased mobility, decreased ROM, decreased strength, impaired UE functional use, postural dysfunction, and pain.   ACTIVITY LIMITATIONS: carrying, lifting, sleeping, bathing, dressing, reach over head, hygiene/grooming, and caring for others  PARTICIPATION LIMITATIONS: meal prep, cleaning, laundry, and community activity  PERSONAL FACTORS: Time since onset of injury/illness/exacerbation and 1 comorbidity: RA  are also affecting patient's functional outcome.    REHAB POTENTIAL: Good  CLINICAL DECISION MAKING: Stable/uncomplicated  EVALUATION COMPLEXITY: Low   GOALS:   SHORT TERM GOALS: Target date: 11/30/2023 Pt will demonstrate appropriate understanding and performance of initially prescribed HEP in order to facilitate improved independence with management of symptoms.  Baseline: HEP provided on eval Goal status: INITIAL   2. Pt will score less than or equal to 30% on Quick DASH in order to indicate reduced levels of disability due to shoulder pain (MDC 16-20pts).  Baseline: 38.6%  Goal status: INITIAL   LONG TERM GOALS: Target date: 12/21/2023 Pt will score 20%or less on QuickDASH in order to demonstrate improved perception of function due to symptoms. Baseline: 38.6% Goal status: INITIAL  2.  Pt will demonstrate at least 160 degrees of active shoulder elevation bilaterally in order to demonstrate improved tolerance to functional movement patterns such as reaching overhead.  Baseline: see ROM chart above Goal status: INITIAL  3.  Pt will demonstrate at least 4+/5 global shoulder MMT with less than 2 pt increase in pain for improved symmetry of UE strength and improved tolerance to functional movements.  Baseline: see MMT chart above Goal status: INITIAL  4. Pt will report ability to perform upper body dressing with less than 2 point increase in pain on NPS in order to indicate improved tolerance/independence self care tasks.  Baseline: increased pain with ADLs, especially upper body dressing  Goal status: INITIAL   5. Pt will be able to push/pull up to 15 lbs  for 5 repetitions with less than 3 pt increase in resting pain in order to facilitate improved tolerance to housework and recreational activities.  Baseline: avoiding higher level recreational tasks (pickleball), difficulty pushing/pulling while making bed  Goal status: INITIAL  PLAN:  PT FREQUENCY: 1-2x/week  PT DURATION: 6 weeks  PLANNED INTERVENTIONS: 97164- PT  Re-evaluation, 97110-Therapeutic exercises, 97530- Therapeutic activity, [id number]- Neuromuscular re-education, 97535- Self Care, 16109- Manual therapy, [phone]- Aquatic Therapy, Patient/Family education, Taping, Dry Needling, Joint mobilization, Spinal mobilization, Cryotherapy, and Moist heat  PLAN FOR NEXT SESSION: Review/update HEP PRN. Work on [employer] exercises as appropriate with emphasis on postural extension, RC strengthening. Also working on pacing of tasks, discussion/education re: activity modification in context of symptom exacerbations. Symptom modification strategies as indicated/appropriate.    Ashley Murrain PT, DPT 11/09/2023 10:02 AM

## 2023-11-09 ENCOUNTER — Other Ambulatory Visit: Payer: Self-pay

## 2023-11-09 ENCOUNTER — Ambulatory Visit: Payer: BC Managed Care – PPO | Attending: Sports Medicine | Admitting: Physical Therapy

## 2023-11-09 ENCOUNTER — Encounter: Payer: Self-pay | Admitting: Physical Therapy

## 2023-11-09 DIAGNOSIS — M25512 Pain in left shoulder: Secondary | ICD-10-CM | POA: Diagnosis present

## 2023-11-09 DIAGNOSIS — M0579 Rheumatoid arthritis with rheumatoid factor of multiple sites without organ or systems involvement: Secondary | ICD-10-CM | POA: Diagnosis not present

## 2023-11-09 DIAGNOSIS — G8929 Other chronic pain: Secondary | ICD-10-CM | POA: Insufficient documentation

## 2023-11-09 DIAGNOSIS — M25511 Pain in right shoulder: Secondary | ICD-10-CM | POA: Insufficient documentation

## 2023-11-09 DIAGNOSIS — R293 Abnormal posture: Secondary | ICD-10-CM | POA: Diagnosis present

## 2023-11-10 ENCOUNTER — Ambulatory Visit: Payer: BC Managed Care – PPO | Admitting: Sports Medicine

## 2023-11-10 DIAGNOSIS — M0579 Rheumatoid arthritis with rheumatoid factor of multiple sites without organ or systems involvement: Secondary | ICD-10-CM | POA: Diagnosis not present

## 2023-11-10 DIAGNOSIS — M7541 Impingement syndrome of right shoulder: Secondary | ICD-10-CM

## 2023-11-10 NOTE — Assessment & Plan Note (Signed)
History from last visit:  This is a very pleasant 43 year old female, she recently went a trip to [place], she had a flare of pain both hands MCPs with significant swelling, erythema, both shoulders. She saw a rheumatologist in [place], some blood work was obtained that did show a positive rheumatoid factor, positive CRP. I do not see that a CCP was done. We have scanned these into the chart. She was started on methotrexate, she is on an up taper, and will eventually be on 15 mg weekly, folic acid twice a week, she is on a prednisone taper currently. When she finishes the prednisone taper we will go ahead and get her started on meloxicam, I would also like x-rays of both hands, both shoulders, she will start formal physical therapy for her shoulders and return to see me in about 6 weeks. Ultimately we also need to get her plugged in with a rheumatologist here.

## 2023-11-10 NOTE — Assessment & Plan Note (Signed)
Much improved with meloxicam and formal PT, we filled out her FMLA paperwork today for intermittent leave covering her missed work for appointments with me and physical therapy, since she is doing so well I can see her back only on an as-needed basis. I have encouraged her to establish with a primary care provider, and she will also need a rheumatologist due to her recently diagnosed rheumatoid arthritis.

## 2023-11-10 NOTE — Progress Notes (Signed)
    Procedures performed today:    None.  Independent interpretation of notes and tests performed by another provider:   None.  Brief History, Exam, Impression, and Recommendations:    Impingement syndrome, shoulder, right Much improved with meloxicam and formal PT, we filled out her FMLA paperwork today for intermittent leave covering her missed work for appointments with me and physical therapy, since she is doing so well I can see her back only on an as-needed basis. I have encouraged her to establish with a primary care provider, and she will also need a rheumatologist due to her recently diagnosed rheumatoid arthritis.  Rheumatoid arthritis, seropositive, multiple sites [facility]) History from last visit:  This is a very pleasant 43 year old female, she recently went a trip to [place], she had a flare of pain both hands MCPs with significant swelling, erythema, both shoulders. She saw a rheumatologist in [place], some blood work was obtained that did show a positive rheumatoid factor, positive CRP. I do not see that a CCP was done. We have scanned these into the chart. She was started on methotrexate, she is on an up taper, and will eventually be on 15 mg weekly, folic acid twice a week, she is on a prednisone taper currently. When she finishes the prednisone taper we will go ahead and get her started on meloxicam, I would also like x-rays of both hands, both shoulders, she will start formal physical therapy for her shoulders and return to see me in about 6 weeks. Ultimately we also need to get her plugged in with a rheumatologist here.    ____________________________________________ Ihor Austin. Benjamin Stain, M.D., ABFM., CAQSM., AME. Primary Care and Sports Medicine [facility]  Adjunct Professor of Family Medicine  [employer] [facility] [employer]  Restaurant manager, fast food

## 2023-11-17 ENCOUNTER — Ambulatory Visit: Payer: BC Managed Care – PPO | Attending: Sports Medicine | Admitting: Physical Therapy

## 2023-11-17 ENCOUNTER — Encounter: Payer: Self-pay | Admitting: Physical Therapy

## 2023-11-17 DIAGNOSIS — M25512 Pain in left shoulder: Secondary | ICD-10-CM | POA: Insufficient documentation

## 2023-11-17 DIAGNOSIS — R293 Abnormal posture: Secondary | ICD-10-CM | POA: Diagnosis present

## 2023-11-17 DIAGNOSIS — M25511 Pain in right shoulder: Secondary | ICD-10-CM | POA: Diagnosis not present

## 2023-11-17 DIAGNOSIS — G8929 Other chronic pain: Secondary | ICD-10-CM | POA: Insufficient documentation

## 2023-11-17 NOTE — Therapy (Signed)
 OUTPATIENT PHYSICAL THERAPY TREATMENT   Patient Name: Hannah Myers MRN: [id number] DOB:05-21-1981, 43 y.o., female Today's Date: 11/17/2023  END OF SESSION:  PT End of Session - 11/17/23 0800     Visit Number 2    Number of Visits 13    Date for PT Re-Evaluation 12/21/23    Authorization Type [employer]    PT Start Time 0800    PT Stop Time 0841    PT Time Calculation (min) 41 min    Activity Tolerance Patient tolerated treatment well              History reviewed. No pertinent past medical history. Past Surgical History:  Procedure Laterality Date   ABDOMINAL HYSTERECTOMY     Patient Active Problem List   Diagnosis Date Noted   Rheumatoid arthritis, seropositive, multiple sites (HCC) 10/25/2023   Impingement syndrome, shoulder, right 04/27/2022    PCP: no PCP in chart  REFERRING PROVIDER: Curtis Debby PARAS, MD  REFERRING DIAG: M05.79 (ICD-10-CM) - Rheumatoid arthritis, seropositive, multiple sites (HCC)  THERAPY DIAG:  Chronic pain of both shoulders  Abnormal posture  Rationale for Evaluation and Treatment: Rehabilitation  ONSET DATE: ~ 8 years ago, increasing over past year  SUBJECTIVE:                                                                                                                                                                                     Per eval - Pt arrives w/ in person interpreter who is present throughout, although pt communicates proficiently in English throughout session without issue. Pt endorses shoulder pain over past 8 years or so, has fluctuated historically but increased more steadily over past year. States she used to only have flares 1-2x/year, but progressed to the point she would have 1-2 flares a week, typically last a couple days. She states she has had to modify activities to avoid flares. Enjoys participating in sports with her children and has had to limit this - states she had a significant flare after trying to  play pickleball a couple months ago. She does note that with a recent change in her medication symptoms have improved notably, but still persist and limit activities. She states pain is typically anterior in both shoulders, R>L. On the R arm she will get pain down to elbow at times, and will have tingling in index/middle finger (usually just before flares occur). Denies any LUE referred pain/tingling. She denies any recent changes in regards to headaches, diplopia, speech/swallowing.  Hand dominance: Right  SUBJECTIVE STATEMENT: 11/17/2023 Pt states she has been doing well since initial eval, doing well with HEP. No other new updates. Pt arrives w/  in person interpreter but politely declines their assistance - no apparent issues communicating today.    PERTINENT HISTORY: RA  PAIN:  Are you having pain: 2/10 R shoulder only  Per eval -  Location/description: BIL shoulders R>L, anterior shoulder; sometimes on R shoulder will refer to elbow  Best-worst over past week: 0-3/10 (since medication change) - aggravating factors: bathing, upper body dressing, doing hair, making bed, reaching overhead, lying supine - Easing factors: medication,  standing  PRECAUTIONS: None  WEIGHT BEARING RESTRICTIONS: No  FALLS:  Has patient fallen in last 6 months? No  LIVING ENVIRONMENT: 1 level home, 4-5 STE Lives w/ husband and 2 kids (15 and 32) Housework split  OCCUPATION: Works in office  PLOF: Independent  PATIENT GOALS: learn how to manage symptoms independently   NEXT MD VISIT: end of February   OBJECTIVE:  Note: Objective measures were completed at Evaluation unless otherwise noted.  DIAGNOSTIC FINDINGS:  BIL shoulder XR 10/25/23 - reassuring, refer to [facility] for details BIL hand XR 10/25/23 - reassuring, refer to [facility] for details  PATIENT SURVEYS:  QuickDASH: 38.6%   COGNITION: Overall cognitive status: Within functional limits for tasks assessed     SENSATION: Light touch intact  BIL UE   POSTURE: Forward head, rounded shoulders BIL, increased thoracic kyphosis  UPPER EXTREMITY ROM:  A/PROM Right eval Left eval  Shoulder flexion 151 deg 160 deg  Shoulder abduction 105 deg * 130 deg *  Shoulder internal rotation    Shoulder external rotation (functional combo) Thumb at scap spine * Thumb at scap spine *  Elbow flexion    Elbow extension    Wrist flexion    Wrist extension     (Blank rows = not tested) (Key: WFL = within functional limits not formally assessed, * = concordant pain, s = stiffness/stretching sensation, NT = not tested)  Comments: cervical ROM WFL and painless all directions  UPPER EXTREMITY MMT:  MMT Right eval Left eval  Shoulder flexion 5 5  Shoulder extension    Shoulder abduction 4+ 4 *  Shoulder extension    Shoulder internal rotation 4 * 4+  Shoulder external rotation 4+ 4+  Elbow flexion    Elbow extension    Grip strength    (Blank rows = not tested)  (Key: WFL = within functional limits not formally assessed, * = concordant pain, s = stiffness/stretching sensation, NT = not tested)  Comments:   SHOULDER SPECIAL TESTS: Positive neer's BIL, R more so than L   PALPATION:  Concordant tenderness L infraspinatus/deltoid, tightness R side but no overt pain                                                                                                                             TREATMENT DATE:  [facility] Adult PT Treatment:  DATE: 11/17/23 Therapeutic Exercise: Double ER + scap retraction unresisted x12 cues for scap retraction Thoracic extension in chair 2x10 cues for arm positioning Swiss ball GH flexion up wall x8 cues for comfortable ROM RC ER iso walkouts red band x5 BIL UE cues for positioning RC IR iso walkout red band x5 BIL  HEP update + education/handout, education on rationale for interventions  Neuromuscular re-ed: Double ER + scap retraction 2x8 w/ 3-3-3 tempo for inc  periscapular/RC activation Standing swiss ball press down shoulders at 90 deg for improved closed chain 2x8 Green band row 2x8 cues for scapular mechanics Shoulder abduction isometric at wall, x8 BIL for inc deltoid activation cues for pacing/form    [facility] Adult PT Treatment:                                                DATE: 11/09/23 Therapeutic Exercise: Double ER + scap retraction unresisted x8 cues for posture/form Thoracic extension, seated x8 cues for comfortable ROM HEP handout + education, relevant anatomy/physiology, rationale for interventions    PATIENT EDUCATION: Education details: rationale for interventions, HEP  Person educated: Patient Education method: Explanation, Demonstration, Tactile cues, Verbal cues Education comprehension: verbalized understanding, returned demonstration, verbal cues required, tactile cues required, and needs further education     HOME EXERCISE PROGRAM: Access Code: [email or web address] URL: [email or web address] Date: 11/17/2023 Prepared by: Alm Jenny  Exercises - Seated Thoracic Lumbar Extension  - 2-3 x daily - 1 sets - 8-10 reps - Shoulder External Rotation and Scapular Retraction with Resistance  - 2-3 x daily - 1 sets - 8 reps - Standing Shoulder Row with Anchored Resistance  - 2-3 x daily - 1 sets - 8 reps  ASSESSMENT:  CLINICAL IMPRESSION: 11/17/2023 Pt arrives w/ 2/10 pain on NPS, no issues after initial evaluation. Today focusing on expansion of program addressing GH/thoracic mobility and periscapular/RC activation/stability. Tolerates well with some mild muscular fatigue but no increase in pain, no adverse events; cues as above. Recommend continuing along current POC in order to address relevant deficits and improve functional tolerance. Pt departs today's session in no acute distress, all voiced questions/concerns addressed appropriately from PT perspective.    Per eval - Patient is a pleasant 43 y.o. woman who was seen  today for physical therapy evaluation and treatment for RA with R>L shoulder pain. She endorses symptoms for ~8 years, increased over past year and limiting her ability to perform housework and self care tasks, as well as limiting participation in recreational activities with her children. She does report noted improvement since recent medication change. On exam today she demonstrates reduced GH mobility/strength, postural deficits as above, and concordant tenderness L RC musculature; significantly positive Neer's sign BIL (R>L). 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 No adverse events. Recommend trial of skilled PT to address aforementioned deficits with aim of improving functional tolerance and reducing pain with typical activities. Pt departs today's session in no acute distress, all voiced concerns/questions addressed appropriately from PT perspective.    OBJECTIVE IMPAIRMENTS: decreased activity tolerance, decreased endurance, decreased mobility, decreased ROM, decreased strength, impaired UE functional use, postural dysfunction, and pain.   ACTIVITY LIMITATIONS: carrying, lifting, sleeping, bathing, dressing, reach over head, hygiene/grooming, and  caring for others  PARTICIPATION LIMITATIONS: meal prep, cleaning, laundry, and community activity  PERSONAL FACTORS: Time since onset of injury/illness/exacerbation and 1 comorbidity: RA  are also affecting patient's functional outcome.   REHAB POTENTIAL: Good  CLINICAL DECISION MAKING: Stable/uncomplicated  EVALUATION COMPLEXITY: Low   GOALS:   SHORT TERM GOALS: Target date: 11/30/2023 Pt will demonstrate appropriate understanding and performance of initially prescribed HEP in order to facilitate improved independence with management of symptoms.  Baseline: HEP  provided on eval Goal status: INITIAL   2. Pt will score less than or equal to 30% on Quick DASH in order to indicate reduced levels of disability due to shoulder pain (MDC 16-20pts).  Baseline: 38.6%  Goal status: INITIAL   LONG TERM GOALS: Target date: 12/21/2023 Pt will score 20%or less on QuickDASH in order to demonstrate improved perception of function due to symptoms. Baseline: 38.6% Goal status: INITIAL  2.  Pt will demonstrate at least 160 degrees of active shoulder elevation bilaterally in order to demonstrate improved tolerance to functional movement patterns such as reaching overhead.  Baseline: see ROM chart above Goal status: INITIAL  3.  Pt will demonstrate at least 4+/5 global shoulder MMT with less than 2 pt increase in pain for improved symmetry of UE strength and improved tolerance to functional movements.  Baseline: see MMT chart above Goal status: INITIAL  4. Pt will report ability to perform upper body dressing with less than 2 point increase in pain on NPS in order to indicate improved tolerance/independence self care tasks.  Baseline: increased pain with ADLs, especially upper body dressing  Goal status: INITIAL   5. Pt will be able to push/pull up to 15 lbs for 5 repetitions with less than 3 pt increase in resting pain in order to facilitate improved tolerance to housework and recreational activities.  Baseline: avoiding higher level recreational tasks (pickleball), difficulty pushing/pulling while making bed  Goal status: INITIAL  PLAN:  PT FREQUENCY: 1-2x/week  PT DURATION: 6 weeks  PLANNED INTERVENTIONS: 97164- PT Re-evaluation, 97110-Therapeutic exercises, 97530- Therapeutic activity, [id number]- Neuromuscular re-education, 97535- Self Care, [id number]- Manual therapy, [phone]- Aquatic Therapy, Patient/Family education, Taping, Dry Needling, Joint mobilization, Spinal mobilization, Cryotherapy, and Moist heat  PLAN FOR NEXT SESSION: Review/update HEP PRN. Work on  [employer] exercises as appropriate with emphasis on postural extension, RC strengthening. Also working on pacing of tasks, discussion/education re: activity modification in context of symptom exacerbations. Symptom modification strategies as indicated/appropriate.    Alm DELENA Jenny PT, DPT 11/17/2023 8:45 AM

## 2023-11-23 NOTE — Therapy (Signed)
OUTPATIENT PHYSICAL THERAPY TREATMENT   Patient Name: Hannah Myers MRN: [id number] DOB:Feb 16, 1981, 43 y.o., female Today's Date: 11/24/2023  END OF SESSION:  PT End of Session - 11/24/23 0755     Visit Number 3    Number of Visits 13    Date for PT Re-Evaluation 12/21/23    Authorization Type [employer]    PT Start Time 0757    PT Stop Time 0844    PT Time Calculation (min) 47 min    Activity Tolerance Patient tolerated treatment well               History reviewed. No pertinent past medical history. Past Surgical History:  Procedure Laterality Date   ABDOMINAL HYSTERECTOMY     Patient Active Problem List   Diagnosis Date Noted   Rheumatoid arthritis, seropositive, multiple sites (HCC) 10/25/2023   Impingement syndrome, shoulder, right 04/27/2022    PCP: no PCP in chart  REFERRING PROVIDER: Monica Becton, MD  REFERRING DIAG: M05.79 (ICD-10-CM) - Rheumatoid arthritis, seropositive, multiple sites (HCC)  THERAPY DIAG:  Chronic pain of both shoulders  Abnormal posture  Rationale for Evaluation and Treatment: Rehabilitation  ONSET DATE: ~ 8 years ago, increasing over past year  SUBJECTIVE:                                                                                                                                                                                     Per eval - Pt arrives w/ in person interpreter who is present throughout, although pt communicates proficiently in English throughout session without issue. Pt endorses shoulder pain over past 8 years or so, has fluctuated historically but increased more steadily over past year. States she used to only have flares 1-2x/year, but progressed to the point she would have 1-2 flares a week, typically last a couple days. She states she has had to modify activities to avoid flares. Enjoys participating in sports with her children and has had to limit this - states she had a significant flare after trying  to play pickleball a couple months ago. She does note that with a recent change in her medication symptoms have improved notably, but still persist and limit activities. She states pain is typically anterior in both shoulders, R>L. On the R arm she will get pain down to elbow at times, and will have tingling in index/middle finger (usually just before flares occur). Denies any LUE referred pain/tingling. She denies any recent changes in regards to headaches, diplopia, speech/swallowing.  Hand dominance: Right  SUBJECTIVE STATEMENT: 11/24/2023 feels like pain is improving, feels like she has better tolerance to activities such as typing. Hasn't had any tingling  in RUE, reports reduced popping/clicking. Mild soreness for a couple hours after last session but not intense. Mild pain at present, 1/10.    PERTINENT HISTORY: RA  PAIN:  Are you having pain: 1/10 R shoulder   Per eval -  Location/description: BIL shoulders R>L, anterior shoulder; sometimes on R shoulder will refer to elbow  Best-worst over past week: 0-3/10 (since medication change) - aggravating factors: bathing, upper body dressing, doing hair, making bed, reaching overhead, lying supine - Easing factors: medication,  standing  PRECAUTIONS: None  WEIGHT BEARING RESTRICTIONS: No  FALLS:  Has patient fallen in last 6 months? No  LIVING ENVIRONMENT: 1 level home, 4-5 STE Lives w/ husband and 2 kids (15 and 22) Housework split  OCCUPATION: Works in office  PLOF: Independent  PATIENT GOALS: learn how to manage symptoms independently   NEXT MD VISIT: end of February   OBJECTIVE:  Note: Objective measures were completed at Evaluation unless otherwise noted.  DIAGNOSTIC FINDINGS:  BIL shoulder XR 10/25/23 - reassuring, refer to [facility] for details BIL hand XR 10/25/23 - reassuring, refer to [facility] for details  PATIENT SURVEYS:  QuickDASH: 38.6%   COGNITION: Overall cognitive status: Within functional limits for tasks  assessed     SENSATION: Light touch intact BIL UE   POSTURE: Forward head, rounded shoulders BIL, increased thoracic kyphosis  UPPER EXTREMITY ROM:  A/PROM Right eval Left eval  Shoulder flexion 151 deg 160 deg  Shoulder abduction 105 deg * 130 deg *  Shoulder internal rotation    Shoulder external rotation (functional combo) Thumb at scap spine * Thumb at scap spine *  Elbow flexion    Elbow extension    Wrist flexion    Wrist extension     (Blank rows = not tested) (Key: WFL = within functional limits not formally assessed, * = concordant pain, s = stiffness/stretching sensation, NT = not tested)  Comments: cervical ROM WFL and painless all directions  UPPER EXTREMITY MMT:  MMT Right eval Left eval  Shoulder flexion 5 5  Shoulder extension    Shoulder abduction 4+ 4 *  Shoulder extension    Shoulder internal rotation 4 * 4+  Shoulder external rotation 4+ 4+  Elbow flexion    Elbow extension    Grip strength    (Blank rows = not tested)  (Key: WFL = within functional limits not formally assessed, * = concordant pain, s = stiffness/stretching sensation, NT = not tested)  Comments:   SHOULDER SPECIAL TESTS: Positive neer's BIL, R more so than L   PALPATION:  Concordant tenderness L infraspinatus/deltoid, tightness R side but no overt pain                                                                                                                             TREATMENT DATE:  [facility] Adult PT Treatment:  DATE: 11/24/23 Therapeutic Exercise: Seated thoracic extension x12 cues for pacing Seated thoracolumbar rotation x10 BIL  Swiss ball flexion up wall x8 cues for comfortable ROM Red band ER x8 BIL cues for setup and appropriate ROM  Red band IR x8 BIL cues for setup Yellow band serratus pull, limited ROM x10 BIL Swiss ball flexion rollout at table x10 HEP update + education/handout  Neuromuscular re-ed: Swiss ball  up wall + end range push for OH stability x8 Tempo (3-3-3) double ER + scap retraction x12 BIL scaption red band at wrist for improved OH stability and RC activation 2x5 Green band row 2x10     [facility] Adult PT Treatment:                                                DATE: 11/17/23 Therapeutic Exercise: Double ER + scap retraction unresisted x12 cues for scap retraction Thoracic extension in chair 2x10 cues for arm positioning Swiss ball GH flexion up wall x8 cues for comfortable ROM RC ER iso walkouts red band x5 BIL UE cues for positioning RC IR iso walkout red band x5 BIL  HEP update + education/handout, education on rationale for interventions  Neuromuscular re-ed: Double ER + scap retraction 2x8 w/ 3-3-3 tempo for inc periscapular/RC activation Standing swiss ball press down shoulders at 90 deg for improved closed chain 2x8 Green band row 2x8 cues for scapular mechanics Shoulder abduction isometric at wall, x8 BIL for inc deltoid activation cues for pacing/form    [facility] Adult PT Treatment:                                                DATE: 11/09/23 Therapeutic Exercise: Double ER + scap retraction unresisted x8 cues for posture/form Thoracic extension, seated x8 cues for comfortable ROM HEP handout + education, relevant anatomy/physiology, rationale for interventions    PATIENT EDUCATION: Education details: rationale for interventions, HEP  Person educated: Patient Education method: Explanation, Demonstration, Tactile cues, Verbal cues Education comprehension: verbalized understanding, returned demonstration, verbal cues required, tactile cues required, and needs further education     HOME EXERCISE PROGRAM: Access Code: [email or web address] URL: [email or web address] Date: 11/24/2023 Prepared by: Fransisco Hertz  Exercises - Seated Thoracic Lumbar Extension  - 2-3 x daily - 1 sets - 8-10 reps - Shoulder External Rotation and Scapular Retraction with Resistance  - 2-3 x  daily - 1 sets - 8 reps - Standing Shoulder Row with Anchored Resistance  - 2-3 x daily - 1 sets - 10 reps - Shoulder External Rotation with Anchored Resistance  - 2-3 x daily - 1 sets - 6-8 reps - Shoulder Internal Rotation with Resistance  - 2-3 x daily - 1 sets - 6-8 reps  ASSESSMENT:  CLINICAL IMPRESSION: 11/24/2023 Pt arrives w/ 1/10 pain, reports good response to last session and improving pain overall. Today progressing familiar exercises for volume, progressing for increased emphasis on overhead stability. Tolerates well with muscular fatigue, reports resolution of pain on departure. No adverse events. Recommend continuing along current POC in order to address relevant deficits and improve functional tolerance. Pt departs today's session in no acute distress, all voiced questions/concerns addressed appropriately from PT perspective.    Per eval - Patient  is a pleasant 43 y.o. woman who was seen today for physical therapy evaluation and treatment for RA with R>L shoulder pain. She endorses symptoms for ~8 years, increased over past year and limiting her ability to perform housework and self care tasks, as well as limiting participation in recreational activities with her children. She does report noted improvement since recent medication change. On exam today she demonstrates reduced GH mobility/strength, postural deficits as above, and concordant tenderness L RC musculature; significantly positive Neer's sign BIL (R>L). 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 No adverse events. Recommend trial of skilled PT to address aforementioned deficits with aim of improving functional tolerance and reducing pain with typical activities. Pt departs today's session in no acute distress, all voiced concerns/questions addressed  appropriately from PT perspective.    OBJECTIVE IMPAIRMENTS: decreased activity tolerance, decreased endurance, decreased mobility, decreased ROM, decreased strength, impaired UE functional use, postural dysfunction, and pain.   ACTIVITY LIMITATIONS: carrying, lifting, sleeping, bathing, dressing, reach over head, hygiene/grooming, and caring for others  PARTICIPATION LIMITATIONS: meal prep, cleaning, laundry, and community activity  PERSONAL FACTORS: Time since onset of injury/illness/exacerbation and 1 comorbidity: RA  are also affecting patient's functional outcome.   REHAB POTENTIAL: Good  CLINICAL DECISION MAKING: Stable/uncomplicated  EVALUATION COMPLEXITY: Low   GOALS:   SHORT TERM GOALS: Target date: 11/30/2023 Pt will demonstrate appropriate understanding and performance of initially prescribed HEP in order to facilitate improved independence with management of symptoms.  Baseline: HEP provided on eval Goal status: INITIAL   2. Pt will score less than or equal to 30% on Quick DASH in order to indicate reduced levels of disability due to shoulder pain (MDC 16-20pts).  Baseline: 38.6%  Goal status: INITIAL   LONG TERM GOALS: Target date: 12/21/2023 Pt will score 20%or less on QuickDASH in order to demonstrate improved perception of function due to symptoms. Baseline: 38.6% Goal status: INITIAL  2.  Pt will demonstrate at least 160 degrees of active shoulder elevation bilaterally in order to demonstrate improved tolerance to functional movement patterns such as reaching overhead.  Baseline: see ROM chart above Goal status: INITIAL  3.  Pt will demonstrate at least 4+/5 global shoulder MMT with less than 2 pt increase in pain for improved symmetry of UE strength and improved tolerance to functional movements.  Baseline: see MMT chart above Goal status: INITIAL  4. Pt will report ability to perform upper body dressing with less than 2 point increase in pain on NPS in order  to indicate improved tolerance/independence self care tasks.  Baseline: increased pain with ADLs, especially upper body dressing  Goal status: INITIAL   5. Pt will be able to push/pull up to 15 lbs for 5 repetitions with less than 3 pt increase in resting pain in order to facilitate improved tolerance to housework and recreational activities.  Baseline: avoiding higher level recreational tasks (pickleball), difficulty pushing/pulling while making bed  Goal status: INITIAL  PLAN:  PT FREQUENCY: 1-2x/week  PT DURATION: 6 weeks  PLANNED INTERVENTIONS: 97164- PT Re-evaluation, 97110-Therapeutic exercises, 97530- Therapeutic activity, [id number]- Neuromuscular re-education, 97535- Self Care, 16109- Manual therapy, [phone]- Aquatic Therapy, Patient/Family education, Taping, Dry Needling, Joint mobilization, Spinal mobilization, Cryotherapy, and Moist heat  PLAN FOR NEXT SESSION: Review/update HEP PRN. Work on [employer] exercises as appropriate with emphasis on postural extension, RC strengthening. Also working on  pacing of tasks, discussion/education re: activity modification in context of symptom exacerbations. Symptom modification strategies as indicated/appropriate.    Ashley Murrain PT, DPT 11/24/2023 8:46 AM

## 2023-11-24 ENCOUNTER — Ambulatory Visit: Payer: BC Managed Care – PPO | Admitting: Physical Therapy

## 2023-11-24 ENCOUNTER — Encounter: Payer: Self-pay | Admitting: Physical Therapy

## 2023-11-24 DIAGNOSIS — M25512 Pain in left shoulder: Secondary | ICD-10-CM | POA: Diagnosis not present

## 2023-11-24 DIAGNOSIS — M25511 Pain in right shoulder: Secondary | ICD-10-CM | POA: Diagnosis not present

## 2023-11-24 DIAGNOSIS — R293 Abnormal posture: Secondary | ICD-10-CM

## 2023-11-24 DIAGNOSIS — G8929 Other chronic pain: Secondary | ICD-10-CM | POA: Diagnosis not present

## 2023-12-01 ENCOUNTER — Encounter: Payer: Self-pay | Admitting: Physical Therapy

## 2023-12-01 ENCOUNTER — Ambulatory Visit: Payer: BC Managed Care – PPO | Admitting: Physical Therapy

## 2023-12-01 DIAGNOSIS — R293 Abnormal posture: Secondary | ICD-10-CM | POA: Diagnosis not present

## 2023-12-01 DIAGNOSIS — M25511 Pain in right shoulder: Secondary | ICD-10-CM | POA: Diagnosis not present

## 2023-12-01 DIAGNOSIS — M25512 Pain in left shoulder: Secondary | ICD-10-CM | POA: Diagnosis not present

## 2023-12-01 DIAGNOSIS — G8929 Other chronic pain: Secondary | ICD-10-CM

## 2023-12-01 NOTE — Therapy (Signed)
 OUTPATIENT PHYSICAL THERAPY TREATMENT   Patient Name: Hannah Myers MRN: [id number] DOB:Nov 16, 1980, 43 y.o., female Today's Date: 12/01/2023  END OF SESSION:  PT End of Session - 12/01/23 1525     Visit Number 4    Number of Visits 13    Date for PT Re-Evaluation 12/21/23    Authorization Type BC[employer]    PT Start Time 1526    PT Stop Time 1610    PT Time Calculation (min) 44 min    Activity Tolerance Patient tolerated treatment well                History reviewed. No pertinent past medical history. Past Surgical History:  Procedure Laterality Date   ABDOMINAL HYSTERECTOMY     Patient Active Problem List   Diagnosis Date Noted   Rheumatoid arthritis, seropositive, multiple sites (HCC) 10/25/2023   Impingement syndrome, shoulder, right 04/27/2022    PCP: no PCP in chart  REFERRING PROVIDER: Monica Becton, MD  REFERRING DIAG: M05.79 (ICD-10-CM) - Rheumatoid arthritis, seropositive, multiple sites (HCC)  THERAPY DIAG:  Chronic pain of both shoulders  Abnormal posture  Rationale for Evaluation and Treatment: Rehabilitation  ONSET DATE: ~ 8 years ago, increasing over past year  SUBJECTIVE:                                                                                                                                                                                     Per eval - Pt arrives w/ in person interpreter who is present throughout, although pt communicates proficiently in English throughout session without issue. Pt endorses shoulder pain over past 8 years or so, has fluctuated historically but increased more steadily over past year. States she used to only have flares 1-2x/year, but progressed to the point she would have 1-2 flares a week, typically last a couple days. She states she has had to modify activities to avoid flares. Enjoys participating in sports with her children and has had to limit this - states she had a significant flare after  trying to play pickleball a couple months ago. She does note that with a recent change in her medication symptoms have improved notably, but still persist and limit activities. She states pain is typically anterior in both shoulders, R>L. On the R arm she will get pain down to elbow at times, and will have tingling in index/middle finger (usually just before flares occur). Denies any LUE referred pain/tingling. She denies any recent changes in regards to headaches, diplopia, speech/swallowing.  Hand dominance: Right  SUBJECTIVE STATEMENT: 12/01/2023 Pt continues to endorse good progress with PT, not having to use pain medications as often. She does endorse  some increased pain yesterday which she states may have been due to playing with her niece. Also reports good HEP adherence, did have some mild soreness initially after last session but felt better the rest of the day.      PERTINENT HISTORY: RA  PAIN:  Are you having pain: 2/10 R shoulder  Per eval -  Location/description: BIL shoulders R>L, anterior shoulder; sometimes on R shoulder will refer to elbow  Best-worst over past week: 0-3/10 (since medication change) - aggravating factors: bathing, upper body dressing, doing hair, making bed, reaching overhead, lying supine - Easing factors: medication,  standing  PRECAUTIONS: None  WEIGHT BEARING RESTRICTIONS: No  FALLS:  Has patient fallen in last 6 months? No  LIVING ENVIRONMENT: 1 level home, 4-5 STE Lives w/ husband and 2 kids (15 and 20) Housework split  OCCUPATION: Works in office  PLOF: Independent  PATIENT GOALS: learn how to manage symptoms independently   NEXT MD VISIT: end of February   OBJECTIVE:  Note: Objective measures were completed at Evaluation unless otherwise noted.  DIAGNOSTIC FINDINGS:  BIL shoulder XR 10/25/23 - reassuring, refer to [facility] for details BIL hand XR 10/25/23 - reassuring, refer to [facility] for details  PATIENT SURVEYS:  QuickDASH: 38.6%    COGNITION: Overall cognitive status: Within functional limits for tasks assessed     SENSATION: Light touch intact BIL UE   POSTURE: Forward head, rounded shoulders BIL, increased thoracic kyphosis  UPPER EXTREMITY ROM:  A/PROM Right eval Left eval R/L 12/01/23  Shoulder flexion 151 deg 160 deg 155 deg * / 162 deg  Shoulder abduction 105 deg * 130 deg * 156 / 151 deg  Shoulder internal rotation     Shoulder external rotation (functional combo) Thumb at scap spine * Thumb at scap spine *   Elbow flexion     Elbow extension     Wrist flexion     Wrist extension      (Blank rows = not tested) (Key: WFL = within functional limits not formally assessed, * = concordant pain, s = stiffness/stretching sensation, NT = not tested)  Comments: cervical ROM WFL and painless all directions  UPPER EXTREMITY MMT:  MMT Right eval Left eval  Shoulder flexion 5 5  Shoulder extension    Shoulder abduction 4+ 4 *  Shoulder extension    Shoulder internal rotation 4 * 4+  Shoulder external rotation 4+ 4+  Elbow flexion    Elbow extension    Grip strength    (Blank rows = not tested)  (Key: WFL = within functional limits not formally assessed, * = concordant pain, s = stiffness/stretching sensation, NT = not tested)  Comments:   SHOULDER SPECIAL TESTS: Positive neer's BIL, R more so than L   PALPATION:  Concordant tenderness L infraspinatus/deltoid, tightness R side but no overt pain  TREATMENT DATE:  [facility] Adult PT Treatment:                                                DATE: 12/01/23 Therapeutic Exercise: Swiss ball up wall flexion x10; + end range push x10 Red band ER x11 on R, x12 on L Green band IR x8 BIL Red band serratus pull to ~75deg x8 Red band shoulder press x12 BIL Standing cervicothoracic rotation at wall x8 BIL  Neuromuscular re-ed: Red band  W 2x8  Unilat green band high>low row 2x8 BIL Red band scaption (band at wrist) 3x5 Red band abd pulse x6 (low/mid)    [facility] Adult PT Treatment:                                                DATE: 11/24/23 Therapeutic Exercise: Seated thoracic extension x12 cues for pacing Seated thoracolumbar rotation x10 BIL  Swiss ball flexion up wall x8 cues for comfortable ROM Red band ER x8 BIL cues for setup and appropriate ROM  Red band IR x8 BIL cues for setup Yellow band serratus pull, limited ROM x10 BIL Swiss ball flexion rollout at table x10 HEP update + education/handout  Neuromuscular re-ed: Swiss ball up wall + end range push for OH stability x8 Tempo (3-3-3) double ER + scap retraction x12 BIL scaption red band at wrist for improved OH stability and RC activation 2x5 Green band row 2x10     [facility] Adult PT Treatment:                                                DATE: 11/17/23 Therapeutic Exercise: Double ER + scap retraction unresisted x12 cues for scap retraction Thoracic extension in chair 2x10 cues for arm positioning Swiss ball GH flexion up wall x8 cues for comfortable ROM RC ER iso walkouts red band x5 BIL UE cues for positioning RC IR iso walkout red band x5 BIL  HEP update + education/handout, education on rationale for interventions  Neuromuscular re-ed: Double ER + scap retraction 2x8 w/ 3-3-3 tempo for inc periscapular/RC activation Standing swiss ball press down shoulders at 90 deg for improved closed chain 2x8 Green band row 2x8 cues for scapular mechanics Shoulder abduction isometric at wall, x8 BIL for inc deltoid activation cues for pacing/form    PATIENT EDUCATION: Education details: rationale for interventions, HEP  Person educated: Patient Education method: Programmer, multimedia, Demonstration, Tactile cues, Verbal cues Education comprehension: verbalized understanding, returned demonstration, verbal cues required, tactile cues required, and needs further  education     HOME EXERCISE PROGRAM: Access Code: [email or web address] URL: [email or web address] Date: 12/01/2023 Prepared by: Fransisco Hertz  Exercises - Shoulder External Rotation and Scapular Retraction with Resistance  - 2-3 x daily - 1 sets - 8 reps - Standing Shoulder Row with Anchored Resistance  - 2-3 x daily - 1 sets - 10 reps - Shoulder External Rotation with Anchored Resistance  - 2-3 x daily - 1 sets - 6-8 reps - Shoulder Internal Rotation with Resistance  - 2-3 x daily - 1 sets - 6-8 reps - Shoulder  Flexion Wall Slide with Towel  - 2-3 x daily - 1 sets - 8 reps  ASSESSMENT:  CLINICAL IMPRESSION: 12/01/2023 Pt arrives w/ report of 2/10 pain in R shoulder only, continues to endorse steady progress with PT. We continue to work on GH/cervicothoracic mobility but focus more so today on progressions with periscapular/GH stability. Able to progress for resistance with familiar movements and increased complexity with exercises emphasizing GH stability into overhead positions. Tolerates session well overall, muscular fatigue as expected but no increase in resting shoulder pain. No adverse events, pt reports reduced pain on departure compared to arrival. Recommend continuing along current POC in order to address relevant deficits and improve functional tolerance. Pt departs today's session in no acute distress, all voiced questions/concerns addressed appropriately from PT perspective.    Per eval - Patient is a pleasant 43 y.o. woman who was seen today for physical therapy evaluation and treatment for RA with R>L shoulder pain. She endorses symptoms for ~8 years, increased over past year and limiting her ability to perform housework and self care tasks, as well as limiting participation in recreational activities with her children. She does report noted improvement since recent medication change. On exam today she demonstrates reduced GH mobility/strength, postural deficits as above, and  concordant tenderness L RC musculature; significantly positive Neer's sign BIL (R>L). 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 No adverse events. Recommend trial of skilled PT to address aforementioned deficits with aim of improving functional tolerance and reducing pain with typical activities. Pt departs today's session in no acute distress, all voiced concerns/questions addressed appropriately from PT perspective.    OBJECTIVE IMPAIRMENTS: decreased activity tolerance, decreased endurance, decreased mobility, decreased ROM, decreased strength, impaired UE functional use, postural dysfunction, and pain.   ACTIVITY LIMITATIONS: carrying, lifting, sleeping, bathing, dressing, reach over head, hygiene/grooming, and caring for others  PARTICIPATION LIMITATIONS: meal prep, cleaning, laundry, and community activity  PERSONAL FACTORS: Time since onset of injury/illness/exacerbation and 1 comorbidity: RA  are also affecting patient's functional outcome.   REHAB POTENTIAL: Good  CLINICAL DECISION MAKING: Stable/uncomplicated  EVALUATION COMPLEXITY: Low   GOALS:   SHORT TERM GOALS: Target date: 11/30/2023 Pt will demonstrate appropriate understanding and performance of initially prescribed HEP in order to facilitate improved independence with management of symptoms.  Baseline: HEP provided on eval 12/01/23: reports good HEP adherence  Goal status: MET  2. Pt will score less than or equal to 30% on Quick DASH in order to indicate reduced levels of disability due to shoulder pain (MDC 16-20pts).  Baseline: 38.6% 12/01/23: deferred given visit 4 Goal status: ONGOING  LONG TERM GOALS: Target date: 12/21/2023 Pt will score 20%or less on QuickDASH in order to demonstrate improved perception of function due to  symptoms. Baseline: 38.6% Goal status: INITIAL  2.  Pt will demonstrate at least 160 degrees of active shoulder elevation bilaterally in order to demonstrate improved tolerance to functional movement patterns such as reaching overhead.  Baseline: see ROM chart above Goal status: INITIAL  3.  Pt will demonstrate at least 4+/5 global shoulder MMT with less than 2 pt increase in pain for improved symmetry of UE strength and improved tolerance to functional movements.  Baseline: see MMT chart above Goal status: INITIAL  4. Pt will report ability to perform upper body dressing with less than 2 point increase in pain  on NPS in order to indicate improved tolerance/independence self care tasks.  Baseline: increased pain with ADLs, especially upper body dressing  Goal status: INITIAL   5. Pt will be able to push/pull up to 15 lbs for 5 repetitions with less than 3 pt increase in resting pain in order to facilitate improved tolerance to housework and recreational activities.  Baseline: avoiding higher level recreational tasks (pickleball), difficulty pushing/pulling while making bed  Goal status: INITIAL  PLAN:  PT FREQUENCY: 1-2x/week  PT DURATION: 6 weeks  PLANNED INTERVENTIONS: 97164- PT Re-evaluation, 97110-Therapeutic exercises, 97530- Therapeutic activity, [id number]- Neuromuscular re-education, 97535- Self Care, 16109- Manual therapy, [phone]- Aquatic Therapy, Patient/Family education, Taping, Dry Needling, Joint mobilization, Spinal mobilization, Cryotherapy, and Moist heat  PLAN FOR NEXT SESSION: Review/update HEP PRN. Continue working on GH/periscapular stability within pt tolerance, gradual progression towards more overhead and long lever tasks. Symptom modification strategies as indicated/appropriate.    Ashley Murrain PT, DPT 12/01/2023 4:21 PM

## 2023-12-06 ENCOUNTER — Ambulatory Visit: Payer: BC Managed Care – PPO | Admitting: Sports Medicine

## 2023-12-06 DIAGNOSIS — M7541 Impingement syndrome of right shoulder: Secondary | ICD-10-CM | POA: Diagnosis not present

## 2023-12-06 DIAGNOSIS — M0579 Rheumatoid arthritis with rheumatoid factor of multiple sites without organ or systems involvement: Secondary | ICD-10-CM

## 2023-12-06 MED ORDER — METHOTREXATE SODIUM 2.5 MG PO TABS: 15.0000 mg | ORAL_TABLET | ORAL | 0 refills | Status: AC

## 2023-12-06 NOTE — Progress Notes (Signed)
    Procedures performed today:    None.  Independent interpretation of notes and tests performed by another provider:   None.  Brief History, Exam, Impression, and Recommendations:    Rheumatoid arthritis, seropositive, multiple sites [facility]) Previous history: This very pleasant 43 year old female recently went on a trip to [place], she had a flare of pain in both hands, MCPs with swelling, erythema, pain in both shoulders. She saw a rheumatologist in [place], blood work was obtained that did show a positive rheumatoid factor, elevated CRP, I did not see that a CCP was done in [place]. She was started on methotrexate with a final dose of 15 mg weekly, folic acid twice a week. She was also on a prednisone taper. She finished the prednisone taper and we started meloxicam, and she has improved dramatically. I will refill her methotrexate, she does understand she really needs to get back in with rheumatology for refills after today. We will give her the contact information as the referral has already been faxed off.  Impingement syndrome, shoulder, right Shoulder impingement syndrome resolved with meloxicam and formal PT.    ____________________________________________ Ihor Austin. Benjamin Stain, M.D., ABFM., CAQSM., AME. Primary Care and Sports Medicine [facility]  Adjunct Professor of Family Medicine  [employer]  Restaurant manager, fast food

## 2023-12-06 NOTE — Assessment & Plan Note (Signed)
 Shoulder impingement syndrome resolved with meloxicam and formal PT.

## 2023-12-06 NOTE — Assessment & Plan Note (Signed)
 Previous history: This very pleasant 43 year old female recently went on a trip to [place], she had a flare of pain in both hands, MCPs with swelling, erythema, pain in both shoulders. She saw a rheumatologist in [place], blood work was obtained that did show a positive rheumatoid factor, elevated CRP, I did not see that a CCP was done in [place]. She was started on methotrexate with a final dose of 15 mg weekly, folic acid twice a week. She was also on a prednisone taper. She finished the prednisone taper and we started meloxicam, and she has improved dramatically. I will refill her methotrexate, she does understand she really needs to get back in with rheumatology for refills after today. We will give her the contact information as the referral has already been faxed off.

## 2023-12-08 ENCOUNTER — Ambulatory Visit: Payer: BC Managed Care – PPO | Admitting: Physical Therapy

## 2023-12-08 ENCOUNTER — Encounter: Payer: Self-pay | Admitting: Physical Therapy

## 2023-12-08 DIAGNOSIS — R293 Abnormal posture: Secondary | ICD-10-CM | POA: Diagnosis not present

## 2023-12-08 DIAGNOSIS — G8929 Other chronic pain: Secondary | ICD-10-CM | POA: Diagnosis not present

## 2023-12-08 DIAGNOSIS — M25511 Pain in right shoulder: Secondary | ICD-10-CM | POA: Diagnosis not present

## 2023-12-08 DIAGNOSIS — M25512 Pain in left shoulder: Secondary | ICD-10-CM | POA: Diagnosis not present

## 2023-12-08 NOTE — Therapy (Signed)
 OUTPATIENT PHYSICAL THERAPY TREATMENT + DISCHARGE SUMMARY   Patient Name: Hannah Myers MRN: [id number] DOB:02/04/1981, 43 y.o., female Today's Date: 12/08/2023   PHYSICAL THERAPY DISCHARGE SUMMARY  Visits from Start of Care: 5  Current functional level related to goals / functional outcomes: No limitations or pain per pt report   Remaining deficits: Mild GH mobility limitations   Education / Equipment: HEP, discharge education, follow up with provider, activity modification as needed   Patient agrees to discharge. Patient goals were met. Patient is being discharged due to meeting the stated rehab goals, being pleased with current functional level.    END OF SESSION:  PT End of Session - 12/08/23 0758     Visit Number 5    Number of Visits 13    Date for PT Re-Evaluation 12/21/23    Authorization Type [employer]    PT Start Time 0758    PT Stop Time 0833    PT Time Calculation (min) 35 min    Activity Tolerance Patient tolerated treatment well                 History reviewed. No pertinent past medical history. Past Surgical History:  Procedure Laterality Date   ABDOMINAL HYSTERECTOMY     Patient Active Problem List   Diagnosis Date Noted   Rheumatoid arthritis, seropositive, multiple sites (HCC) 10/25/2023   Impingement syndrome, shoulder, right 04/27/2022    PCP: no PCP in chart  REFERRING PROVIDER: Monica Becton, MD  REFERRING DIAG: M05.79 (ICD-10-CM) - Rheumatoid arthritis, seropositive, multiple sites (HCC)  THERAPY DIAG:  Chronic pain of both shoulders  Abnormal posture  Rationale for Evaluation and Treatment: Rehabilitation  ONSET DATE: ~ 8 years ago, increasing over past year  SUBJECTIVE:                                                                                                                                                                                     Per eval - Pt arrives w/ in person interpreter who is present  throughout, although pt communicates proficiently in English throughout session without issue. Pt endorses shoulder pain over past 8 years or so, has fluctuated historically but increased more steadily over past year. States she used to only have flares 1-2x/year, but progressed to the point she would have 1-2 flares a week, typically last a couple days. She states she has had to modify activities to avoid flares. Enjoys participating in sports with her children and has had to limit this - states she had a significant flare after trying to play pickleball a couple months ago. She does note that with a recent change in her medication symptoms  have improved notably, but still persist and limit activities. She states pain is typically anterior in both shoulders, R>L. On the R arm she will get pain down to elbow at times, and will have tingling in index/middle finger (usually just before flares occur). Denies any LUE referred pain/tingling. She denies any recent changes in regards to headaches, diplopia, speech/swallowing.  Hand dominance: Right  SUBJECTIVE STATEMENT: 12/08/2023 Pt arrives w/o pain, states she is no longer having any limitations due to her shoulders. Denies any pain over the past week, no longer having any N/T. States follow up with referring provider went well. States she feels ready to discharge today.     PERTINENT HISTORY: RA  PAIN:  Are you having pain: 0/10 R shoulder, states no pain at all over past week   Per eval -  Location/description: BIL shoulders R>L, anterior shoulder; sometimes on R shoulder will refer to elbow  Best-worst over past week: 0-3/10 (since medication change) - aggravating factors: bathing, upper body dressing, doing hair, making bed, reaching overhead, lying supine - Easing factors: medication,  standing  PRECAUTIONS: None  WEIGHT BEARING RESTRICTIONS: No  FALLS:  Has patient fallen in last 6 months? No  LIVING ENVIRONMENT: 1 level home, 4-5  STE Lives w/ husband and 2 kids (15 and 8) Housework split  OCCUPATION: Works in office  PLOF: Independent  PATIENT GOALS: learn how to manage symptoms independently   NEXT MD VISIT: none scheduled per pt  OBJECTIVE:  Note: Objective measures were completed at Evaluation unless otherwise noted.  DIAGNOSTIC FINDINGS:  BIL shoulder XR 10/25/23 - reassuring, refer to [facility] for details BIL hand XR 10/25/23 - reassuring, refer to [facility] for details  PATIENT SURVEYS:  QuickDASH: 38.6%  12/08/23 QuickDASH: 0%    COGNITION: Overall cognitive status: Within functional limits for tasks assessed     SENSATION: Light touch intact BIL UE   POSTURE: Forward head, rounded shoulders BIL, increased thoracic kyphosis  UPPER EXTREMITY ROM:  A/PROM Right eval Left eval R/L 12/01/23 R/L 12/08/23  Shoulder flexion 151 deg 160 deg 155 deg * / 162 deg 159/160  Shoulder abduction 105 deg * 130 deg * 156 / 151 deg   Shoulder internal rotation      Shoulder external rotation (functional combo) Thumb at scap spine * Thumb at scap spine *    Elbow flexion      Elbow extension      Wrist flexion      Wrist extension       (Blank rows = not tested) (Key: WFL = within functional limits not formally assessed, * = concordant pain, s = stiffness/stretching sensation, NT = not tested)  Comments: cervical ROM WFL and painless all directions  UPPER EXTREMITY MMT:  MMT Right eval Left eval R/L 12/08/23  Shoulder flexion 5 5 5/5  Shoulder extension     Shoulder abduction 4+ 4 * 5/5  Shoulder extension     Shoulder internal rotation 4 * 4+ 5/5  Shoulder external rotation 4+ 4+ 5/5  Elbow flexion     Elbow extension     Grip strength     (Blank rows = not tested)  (Key: WFL = within functional limits not formally assessed, * = concordant pain, s = stiffness/stretching sensation, NT = not tested)  Comments:   SHOULDER SPECIAL TESTS: Positive neer's BIL, R more so than L  12/08/23: negative neer's  bilaterally   PALPATION:  Concordant tenderness L infraspinatus/deltoid, tightness R side but no overt  pain                                                                                                                             TREATMENT DATE:  [facility] Adult PT Treatment:                                                DATE: 12/08/23 Therapeutic Exercise: Swiss ball + push x12 Green band double ER x6 Green band row x8 Green band IR x5 HEP handout + education, discussion re: gradual progression and regressions as needed  Therapeutic Activity: BIL pulling assessment (10# x8) MSK assessment + education Education/discussion re: progress with PT, symptom behavior as it affects activity tolerance, PT goals/POC, discharge education, follow up with provider as needed     PATIENT EDUCATION: Education details: PT POC, PT goals, progress with PT thus far, discharge planning, HEP, follow up with provider as needed Person educated: Patient Education method: Explanation, Demonstration, Verbal cues Education comprehension: verbalized understanding, returned demonstration   HOME EXERCISE PROGRAM: Access Code: [email or web address] URL: [email or web address] Date: 12/01/2023 Prepared by: Fransisco Hertz  Exercises - Shoulder External Rotation and Scapular Retraction with Resistance  - 2-3 x daily - 1 sets - 8 reps - Standing Shoulder Row with Anchored Resistance  - 2-3 x daily - 1 sets - 10 reps - Shoulder External Rotation with Anchored Resistance  - 2-3 x daily - 1 sets - 6-8 reps - Shoulder Internal Rotation with Resistance  - 2-3 x daily - 1 sets - 6-8 reps - Shoulder Flexion Wall Slide with Towel  - 2-3 x daily - 1 sets - 8 reps  ASSESSMENT:  CLINICAL IMPRESSION: 12/08/2023 Pt arrives w/ report of no pain over past week, no limitations due to shoulder. States she feels ready to discharge at this point. Looking at goals she has made excellent progress with ROM and strength of bilateral  shoulders although remains just shy of ROM and pulling tolerance goals. However, she denies any limitations in daily activities and voices confidence with progressing HEP independently. Given this, mutual decision is made to discharge to independent HEP at this time, encouraged follow up with provider as needed. Tolerates exam/HEP well without any pain, no adverse events. Pt departs today's session in no acute distress, all voiced questions/concerns addressed appropriately from PT perspective.     Per eval - Patient is a pleasant 43 y.o. woman who was seen today for physical therapy evaluation and treatment for RA with R>L shoulder pain. She endorses symptoms for ~8 years, increased over past year and limiting her ability to perform housework and self care tasks, as well as limiting participation in recreational activities with her children. She does report noted improvement since recent medication change. On exam today she demonstrates reduced GH mobility/strength, postural deficits as above, and concordant tenderness  L RC musculature; significantly positive Neer's sign BIL (R>L). 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 No adverse events. Recommend trial of skilled PT to address aforementioned deficits with aim of improving functional tolerance and reducing pain with typical activities. Pt departs today's session in no acute distress, all voiced concerns/questions addressed appropriately from PT perspective.    OBJECTIVE IMPAIRMENTS: decreased ROM.   ACTIVITY LIMITATIONS: pt denies limitations  PARTICIPATION LIMITATIONS: pt denies limitations  PERSONAL FACTORS: Time since onset of injury/illness/exacerbation and 1 comorbidity: RA  are also affecting patient's functional outcome.   REHAB POTENTIAL: Good  CLINICAL DECISION  MAKING: Stable/uncomplicated  EVALUATION COMPLEXITY: Low   GOALS:   SHORT TERM GOALS: Target date: 11/30/2023 Pt will demonstrate appropriate understanding and performance of initially prescribed HEP in order to facilitate improved independence with management of symptoms.  Baseline: HEP provided on eval 12/01/23: reports good HEP adherence  Goal status: MET  2. Pt will score less than or equal to 30% on Quick DASH in order to indicate reduced levels of disability due to shoulder pain (MDC 16-20pts).  Baseline: 38.6% 12/01/23: deferred given visit 4 12/08/23: 0% Goal status: MET  LONG TERM GOALS: Target date: 12/21/2023 Pt will score 20%or less on QuickDASH in order to demonstrate improved perception of function due to symptoms. Baseline: 38.6% 12/08/23: 0% Goal status: MET  2.  Pt will demonstrate at least 160 degrees of active shoulder elevation bilaterally in order to demonstrate improved tolerance to functional movement patterns such as reaching overhead.  Baseline: see ROM chart above 12/08/23: see ROM chart above ; painless Goal status: PARTIALLY MET  3.  Pt will demonstrate at least 4+/5 global shoulder MMT with less than 2 pt increase in pain for improved symmetry of UE strength and improved tolerance to functional movements.  Baseline: see MMT chart above 12/08/23: see MMT chart above Goal status: MET  4. Pt will report ability to perform upper body dressing with less than 2 point increase in pain on NPS in order to indicate improved tolerance/independence self care tasks.  Baseline: increased pain with ADLs, especially upper body dressing  12/08/23: no pain with daily activities over past week  Goal status: MET  5. Pt will be able to push/pull up to 15 lbs for 5 repetitions with less than 3 pt increase in resting pain in order to facilitate improved tolerance to housework and recreational activities.  Baseline: avoiding higher level recreational tasks (pickleball),  difficulty pushing/pulling while making bed  12/08/23: 5 repetitions w/ 10#, limited by fatigue rather than pain ; pt denies any limitations in housework or daily activities  Goal status: NOT MET ; PROGRESSED  PLAN: DISCHARGE 12/08/23  PT FREQUENCY: NA  PT DURATION: NA  PLANNED INTERVENTIONS: NA  PLAN FOR NEXT SESSION: discharge to independent HEP, follow up with provider as needed    Ashley Murrain PT, DPT 12/08/2023 12:43 PM

## 2024-02-27 ENCOUNTER — Other Ambulatory Visit: Payer: Self-pay | Admitting: Sports Medicine

## 2024-02-27 DIAGNOSIS — M0579 Rheumatoid arthritis with rheumatoid factor of multiple sites without organ or systems involvement: Secondary | ICD-10-CM

## 2024-06-12 ENCOUNTER — Encounter: Payer: Self-pay | Admitting: Sports Medicine
